# Patient Record
Sex: Female | Born: 1995 | Race: White | Hispanic: No | Marital: Single | Smoking: Never smoker
Health system: Southern US, Community
[De-identification: ages and names within clinical notes are randomized; demographics above are authoritative.]

## PROBLEM LIST (undated history)

## (undated) DIAGNOSIS — F419 Anxiety disorder, unspecified: Secondary | ICD-10-CM

## (undated) DIAGNOSIS — F319 Bipolar disorder, unspecified: Secondary | ICD-10-CM

## (undated) DIAGNOSIS — F329 Major depressive disorder, single episode, unspecified: Secondary | ICD-10-CM

## (undated) DIAGNOSIS — J45909 Unspecified asthma, uncomplicated: Secondary | ICD-10-CM

## (undated) DIAGNOSIS — R51 Headache: Secondary | ICD-10-CM

## (undated) DIAGNOSIS — F603 Borderline personality disorder: Secondary | ICD-10-CM

## (undated) DIAGNOSIS — F988 Other specified behavioral and emotional disorders with onset usually occurring in childhood and adolescence: Secondary | ICD-10-CM

## (undated) DIAGNOSIS — F32A Depression, unspecified: Secondary | ICD-10-CM

## (undated) DIAGNOSIS — R519 Headache, unspecified: Secondary | ICD-10-CM

## (undated) DIAGNOSIS — G8929 Other chronic pain: Secondary | ICD-10-CM

## (undated) DIAGNOSIS — E785 Hyperlipidemia, unspecified: Secondary | ICD-10-CM

## (undated) DIAGNOSIS — K623 Rectal prolapse: Secondary | ICD-10-CM

## (undated) DIAGNOSIS — K219 Gastro-esophageal reflux disease without esophagitis: Secondary | ICD-10-CM

## (undated) DIAGNOSIS — K589 Irritable bowel syndrome without diarrhea: Secondary | ICD-10-CM

## (undated) HISTORY — DX: Bipolar disorder, unspecified: F31.9

## (undated) HISTORY — DX: Borderline personality disorder: F60.3

## (undated) HISTORY — DX: Unspecified asthma, uncomplicated: J45.909

## (undated) HISTORY — DX: Hyperlipidemia, unspecified: E78.5

## (undated) HISTORY — DX: Irritable bowel syndrome, unspecified: K58.9

## (undated) HISTORY — DX: Other specified behavioral and emotional disorders with onset usually occurring in childhood and adolescence: F98.8

## (undated) HISTORY — PX: OTHER SURGICAL HISTORY: SHX169

## (undated) HISTORY — PX: TONSILLECTOMY AND ADENOIDECTOMY: SHX28

## (undated) HISTORY — DX: Headache: R51

## (undated) HISTORY — DX: Headache, unspecified: R51.9

## (undated) HISTORY — DX: Other chronic pain: G89.29

## (undated) HISTORY — DX: Gastro-esophageal reflux disease without esophagitis: K21.9

---

## 2013-03-28 ENCOUNTER — Institutional Professional Consult (permissible substitution): Payer: Self-pay | Admitting: Internal Medicine

## 2013-04-04 ENCOUNTER — Encounter: Payer: Self-pay | Admitting: Internal Medicine

## 2013-04-04 ENCOUNTER — Ambulatory Visit (INDEPENDENT_AMBULATORY_CARE_PROVIDER_SITE_OTHER): Payer: BC Managed Care – PPO | Admitting: Internal Medicine

## 2013-04-04 VITALS — BP 102/60 | HR 103 | Temp 98.0°F | Ht <= 58 in | Wt 124.0 lb

## 2013-04-04 DIAGNOSIS — Z23 Encounter for immunization: Secondary | ICD-10-CM

## 2013-04-04 DIAGNOSIS — J45991 Cough variant asthma: Secondary | ICD-10-CM

## 2013-04-04 MED ORDER — MOMETASONE FURO-FORMOTEROL FUM 100-5 MCG/ACT IN AERO: INHALATION_SPRAY | RESPIRATORY_TRACT | Status: DC

## 2013-04-04 MED ORDER — LEVALBUTEROL TARTRATE 45 MCG/ACT IN AERO: 1.0000 | INHALATION_SPRAY | RESPIRATORY_TRACT | Status: DC | PRN

## 2013-04-04 NOTE — Progress Notes (Signed)
  Subjective:    Patient ID: Joyce Pena, female    DOB: 05-10-1996   MRN: [id number]  HPI  17 yowf never smoker ? 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 Dx of kidnergarden of asthma and seemed better early HS then worse since 2013 ? [place] in [place] may have made it worse then arrived in [place] last week in Aug 2014 and found couldn't tolerate the outdoors well due to heat and humidity  on no maint rx so referred to pulmonary clinic 04/04/13 by Dr Margaretha Sheffield  04/04/2013 [facility]st [facility] office visit/ Maxine Huynh cc daily symptoms pretty much all her life esp with exercise, no benefit to singulair,  ? advair helped ? Maybe not.  Much worse with viral illnesses inpast and has to take a xopenex neb during these for  Severe cough and sob but otherwise just using the saba hfa  No prednisone in recent years  No obvious daytime variabilty or assoc excess or purulent mucus production or cp or chest tightness, subjective wheeze overt sinus or hb symptoms. No unusual exp hx or h/o childhood pna/ asthma or knowledge of premature birth.   Sleeping ok without nocturnal  or early am exacerbation  of respiratory  c/o's or need for noct saba. Also denies any obvious fluctuation of symptoms with weather or environmental changes or other aggravating or alleviating factors except as outlined above  Current Medications, Allergies, Past Medical History, Past Surgical History, Family History, and Social History were reviewed in [employer] record.         Review of Systems  Constitutional: Negative for fever, chills and unexpected weight change.  HENT: Positive for congestion. Negative for ear pain, nosebleeds, sore throat, rhinorrhea, sneezing, trouble swallowing, dental problem, voice change, postnasal drip and sinus pressure.   Eyes: Negative for visual disturbance.  Respiratory: Positive for shortness of breath. Negative for cough and choking.   Cardiovascular: Negative  for chest pain and leg swelling.  Gastrointestinal: Negative for vomiting, abdominal pain and diarrhea.  Genitourinary: Negative for difficulty urinating.       Heartburn Indigestion  Musculoskeletal: Negative for arthralgias.  Skin: Negative for rash.  Neurological: Positive for headaches. Negative for tremors and syncope.  Hematological: Does not bruise/bleed easily.       Objective:   Physical Exam  amb wf nad  Wt Readings from Last 3 Encounters:  04/04/13 124 lb (56.246 kg) (46%*, Z = -0.09)   * Growth percentiles are based on CDC 2-20 Years data.     HEENT: nl dentition, turbinates, and orophanx. Nl external ear canals without cough reflex   NECK :  without JVD/Nodes/TM/ nl carotid upstrokes bilaterally   LUNGS: no acc muscle use, clear to A and P bilaterally without cough on insp or exp maneuvers   CV:  RRR  no s3 or murmur or increase in P2, no edema   ABD:  soft and nontender with nl excursion in the supine position. No bruits or organomegaly, bowel sounds nl  MS:  warm without deformities, calf tenderness, cyanosis or clubbing  SKIN: warm and dry without lesions    NEURO:  alert, approp, no deficits       Assessment & Plan:

## 2013-04-04 NOTE — Patient Instructions (Addendum)
dulera 100 Take 2 puffs first thing in am and then another 2 puffs about 12 hours later - blow dulera out through nose    Only use your albuterol (xopenox) as a rescue medication to be used if you can't catch your breath by resting or doing a relaxed purse lip breathing pattern. The less you use it, the better it will work when you need it. xopenex up to 2 puffs every 4 hours, and then nebulizer to back up the xopenex hfa  For any heartburn symptoms or for any flare of any respiratory > Try prilosec 20mg   Take 30-60 min before first meal of the day and Pepcid 20 mg one bedtime until cough is completely gone for at least a week without the need for cough suppression  I think of reflux for chronic cough like I do oxygen for fire (doesn't cause the fire but once you get the oxygen suppressed it usually goes away regardless of the exact cause).   GERD (REFLUX)  is an extremely common cause of respiratory symptoms, many times with no significant heartburn at all.    It can be treated with medication, but also with lifestyle changes including avoidance of late meals, excessive alcohol, smoking cessation, and avoid fatty foods, chocolate, peppermint, colas, red wine, and acidic juices such as orange juice.  NO MINT OR MENTHOL PRODUCTS SO NO COUGH DROPS  USE SUGARLESS CANDY INSTEAD (jolley ranchers or Stover's)  NO OIL BASED VITAMINS - use powdered substitutes.    Please schedule a follow up office visit in 6 weeks, call sooner if needed

## 2013-04-06 DIAGNOSIS — J45991 Cough variant asthma: Secondary | ICD-10-CM | POA: Insufficient documentation

## 2013-04-06 NOTE — Assessment & Plan Note (Addendum)
DDX of  difficult airways managment all start with A and  include Adherence, Ace Inhibitors, Acid Reflux, Active Sinus Disease, Alpha 1 Antitripsin deficiency, Anxiety masquerading as Airways dz,  ABPA,  allergy(esp in young), Aspiration (esp in elderly), Adverse effects of DPI,  Active smokers, plus two Bs  = Bronchiectasis and Beta blocker use..and one C= CHF   In this case Adherence is the biggest issue and starts with  inability to use HFA effectively and also  understand that SABA treats the symptoms but doesn't get to the underlying problem (inflammation).  I used  the analogy of putting steroid cream on a rash to help explain the meaning of topical therapy and the need to get the drug to the target tissue.  Try dulera 100 2bid then step down to qvar if all goals of care met  ? Allergy> defer w/u for now pending results of dulera trial    ? Acid reflux > at risk due to bcps> rx diet/ add meds if cough flares   The proper method of use, as well as anticipated side effects, of a metered-dose inhaler are discussed and demonstrated to the patient. Improved effectiveness after extensive coaching during this visit to a level of approximately  90%

## 2013-04-08 ENCOUNTER — Emergency Department (HOSPITAL_COMMUNITY)
Admission: EM | Admit: 2013-04-08 | Discharge: 2013-04-08 | Disposition: A | Discharge status: Home / Self Care | Payer: BC Managed Care – PPO | Attending: Emergency Medicine | Admitting: Emergency Medicine

## 2013-04-08 ENCOUNTER — Encounter (HOSPITAL_COMMUNITY): Payer: Self-pay | Admitting: *Deleted

## 2013-04-08 DIAGNOSIS — Z79899 Other long term (current) drug therapy: Secondary | ICD-10-CM | POA: Insufficient documentation

## 2013-04-08 DIAGNOSIS — F988 Other specified behavioral and emotional disorders with onset usually occurring in childhood and adolescence: Secondary | ICD-10-CM | POA: Insufficient documentation

## 2013-04-08 DIAGNOSIS — J45909 Unspecified asthma, uncomplicated: Secondary | ICD-10-CM | POA: Insufficient documentation

## 2013-04-08 DIAGNOSIS — K625 Hemorrhage of anus and rectum: Secondary | ICD-10-CM | POA: Insufficient documentation

## 2013-04-08 DIAGNOSIS — Z3202 Encounter for pregnancy test, result negative: Secondary | ICD-10-CM | POA: Insufficient documentation

## 2013-04-08 DIAGNOSIS — Z8639 Personal history of other endocrine, nutritional and metabolic disease: Secondary | ICD-10-CM | POA: Insufficient documentation

## 2013-04-08 DIAGNOSIS — Z862 Personal history of diseases of the blood and blood-forming organs and certain disorders involving the immune mechanism: Secondary | ICD-10-CM | POA: Insufficient documentation

## 2013-04-08 LAB — URINALYSIS, ROUTINE W REFLEX MICROSCOPIC
Bilirubin Urine: NEGATIVE
Hgb urine dipstick: NEGATIVE
Specific Gravity, Urine: 1 — ABNORMAL LOW (ref 1.005–1.030)
pH: 7 (ref 5.0–8.0)

## 2013-04-08 LAB — POCT I-STAT, CHEM 8
BUN: <3 mg/dL — ABNORMAL LOW (ref 6–23)
Chloride: 107 mEq/L (ref 96–112)
Creatinine, Ser: 0.9 mg/dL (ref 0.50–1.10)
Potassium: 4 mEq/L (ref 3.5–5.1)
Sodium: 141 mEq/L (ref 135–145)

## 2013-04-08 NOTE — ED Notes (Signed)
Pt is here with blood in her stool that she noticed at 1230 today.  Pt only saw it when she wiped.  Pt has irritable bowel and mucous is normal for her

## 2013-04-08 NOTE — ED Notes (Signed)
1637  Pt is now also C/O pain and cramping in the abdomen and down into the pelvis.

## 2013-04-08 NOTE — ED Provider Notes (Signed)
CSN: [id number]     Arrival date & time 04/08/13  1358 History   First MD Initiated Contact with Patient 04/08/13 1615     Chief Complaint  Patient presents with  . Rectal Bleeding   (Consider location/radiation/quality/duration/timing/severity/associated sxs/prior Treatment) HPI Patient is a Archivist here locally.  She is originally from [place].  She presents the emergency department today because of new rectal bleeding which was noted when she was having a loose stool.  She stated she noted blood on the tissue as well as a very small amount of toilet.  She's had none since then.  She denies abdominal pain.  No nausea or vomiting.  She has no prior history of GI bleeding.  She's on no anticoagulants.  She has a long-standing history of IBS and follows with gastroenterology in [place].  Her symptoms are mild.  She denies vaginal bleeding. Past Medical History  Diagnosis Date  . Asthma   . IBS (irritable bowel syndrome)   . ADD (attention deficit disorder)   . Hyperlipidemia    History reviewed. No pertinent past surgical history. Family History  Problem Relation Age of Onset  . Emphysema Paternal Grandfather   . Asthma Cousin   . Heart disease Father   . Breast cancer Paternal Grandmother   . Colon cancer Paternal Grandmother    History  Substance Use Topics  . Smoking status: Never Smoker   . Smokeless tobacco: Never Used  . Alcohol Use: No   OB History   Grav Para Term Preterm Abortions TAB SAB Ect Mult Living                 Review of Systems  All other systems reviewed and are negative.    Allergies  Review of patient's allergies indicates no known allergies.  Home Medications   Current Outpatient Rx  Name  Route  Sig  Dispense  Refill  . amphetamine-dextroamphetamine (ADDERALL) 5 MG tablet   Oral   Take 5 mg by mouth daily as needed (attention).          Marland Kitchen aspirin-acetaminophen-caffeine (EXCEDRIN MIGRAINE) 250-250-65 MG per tablet   Oral   Take 1  tablet by mouth every 6 (six) hours as needed for pain.         . famotidine (PEPCID) 20 MG tablet   Oral   Take 20 mg by mouth at bedtime.         . levalbuterol (XOPENEX HFA) 45 MCG/ACT inhaler   Inhalation   Inhale 1-2 puffs into the lungs every 4 (four) hours as needed for wheezing or shortness of breath.   1 Inhaler   11   . lisdexamfetamine (VYVANSE) 40 MG capsule   Oral   Take 40 mg by mouth every morning.         . mometasone-formoterol (DULERA) 100-5 MCG/ACT AERO      Take 2 puffs first thing in am and then another 2 puffs about 12 hours later.   1 Inhaler   0   . Multiple Vitamin (MULTIVITAMIN) capsule   Oral   Take 1 capsule by mouth daily.         . Norethindrone Acetate-Ethinyl Estrad-FE (LOMEDIA 24 FE) 1-20 MG-MCG(24) tablet   Oral   Take 1 tablet by mouth daily.         . nortriptyline (PAMELOR) 25 MG capsule   Oral   Take 25 mg by mouth daily.         Marland Kitchen  omeprazole (PRILOSEC OTC) 20 MG tablet   Oral   Take 20 mg by mouth daily.          BP 123/88  Pulse 113  Temp(Src) 97.6 F (36.4 C) (Oral)  Resp 20  SpO2 100%  LMP 03/28/2013 Physical Exam  Nursing note and vitals reviewed. Constitutional: She is oriented to person, place, and time. She appears well-developed and well-nourished. No distress.  HENT:  Head: Normocephalic and atraumatic.  Eyes: EOM are normal.  Neck: Normal range of motion.  Cardiovascular: Normal rate, regular rhythm and normal heart sounds.   Pulmonary/Chest: Effort normal and breath sounds normal.  Abdominal: Soft. She exhibits no distension. There is no tenderness.  Genitourinary:  No gross blood. No masses. No tenderness.  No external hemorrhoids  Musculoskeletal: Normal range of motion.  Neurological: She is alert and oriented to person, place, and time.  Skin: Skin is warm and dry.  Psychiatric: She has a normal mood and affect. Judgment normal.    ED Course  Procedures (including critical care  time) Labs Review Labs Reviewed  URINALYSIS, ROUTINE W REFLEX MICROSCOPIC - Abnormal; Notable for the following:    Specific Gravity, Urine 1.000 (*)    All other components within normal limits  POCT I-STAT, CHEM 8 - Abnormal; Notable for the following:    BUN <3 (*)    Calcium, Ion 1.24 (*)    All other components within normal limits  PREGNANCY, URINE   Imaging Review No results found.  MDM   1. Rectal bleeding    Normal vital signs.  Hemoglobin normal.  Discharge home in good condition.  Outpatient GI followup.  Abdominal exam is benign.  Suspect colitis versus internal hemorrhoid     Lyanne Co, MD 04/08/13 [phone]

## 2013-04-13 ENCOUNTER — Telehealth: Payer: Self-pay | Admitting: Internal Medicine

## 2013-04-13 ENCOUNTER — Encounter: Payer: Self-pay | Admitting: Gastroenterology

## 2013-04-13 ENCOUNTER — Ambulatory Visit (INDEPENDENT_AMBULATORY_CARE_PROVIDER_SITE_OTHER): Payer: BC Managed Care – PPO | Admitting: Gastroenterology

## 2013-04-13 VITALS — BP 110/60 | HR 96 | Ht <= 58 in | Wt 123.1 lb

## 2013-04-13 DIAGNOSIS — K625 Hemorrhage of anus and rectum: Secondary | ICD-10-CM | POA: Insufficient documentation

## 2013-04-13 DIAGNOSIS — R197 Diarrhea, unspecified: Secondary | ICD-10-CM | POA: Insufficient documentation

## 2013-04-13 MED ORDER — HYDROCORTISONE ACETATE 25 MG RE SUPP: RECTAL | Status: DC

## 2013-04-13 NOTE — Telephone Encounter (Signed)
Spoke with patient and she states she went to ED for rectal bleeding. She has had rectal bleeding again today when she went to have a bowel movement. She reports she was diagnosed with IBS 5 years ago. She states she saw a GI MD in [place] at that time. She has never had a colonoscopy. She reports diarrhea 1-3/day which she states is her "norm." She reports abdominal cramping on both sides that goes to her back. She is also having some nausea. Scheduled with Doug Sou, PA at 3:30 PM today.

## 2013-04-13 NOTE — Patient Instructions (Addendum)
We sent a prescription for the Anusol suppositories to use rectally at bedtime to [facility] [address]. You have been scheduled for a colonoscopy with propofol. Please follow written instructions given to you at your visit today.  Please pick up your prep kit at the pharmacy within the next 1-3 days. If you use inhalers (even only as needed), please bring them with you on the day of your procedure.

## 2013-04-13 NOTE — Progress Notes (Signed)
04/13/2013 Marijayne Brose [id number] 07/24/1996   HISTORY OF PRESENT ILLNESS:  Patient is an 17 year old female who just moved here from [place], [place] to go to college at [employer].  She says that she has long-standing diarrhea and was told that she had IBS several years ago.  She had previously seen a GI doctor in [place] for acid reflux but has not been seen there in at least 5 or 6 years.  Has diarrhea 1-3 times a day.  Says that she thinks that she is lactose intolerant as well.  She recently had two episodes of rectal bleeding.  The first one was this past Sunday and she went to the ED where she was hemoccult negative.  She was discharged and apparently told that she should have a colonoscopy.  Says that she has been told that she needs to have a colonoscopy by another physician recently as well.  Then today she had another episode of bleeding described as a moderate amount of bright red blood on the toilet tissue.  Says that her PGF had colon cancer and her father had what sounds like rectal prolapse, but no other family history of chronic GI illness to her knowledge.  Gets crampy abdominal pains that go into her back at times.  Some nausea but no vomiting.  She was recently put back on omeprazole in the morning and pepcid at night by Dr. Wert; she saw him for her asthma.    Past Medical History  Diagnosis Date  . Asthma   . IBS (irritable bowel syndrome)   . ADD (attention deficit disorder)   . Hyperlipidemia   . GERD (gastroesophageal reflux disease)   . Chronic headaches    History reviewed. No pertinent past surgical history.  reports that she has never smoked. She has never used smokeless tobacco. She reports that she does not drink alcohol or use illicit drugs. family history includes Asthma in her cousin; Breast cancer in her paternal grandmother; Colon cancer in her paternal grandfather; Diabetes in her paternal grandfather; Emphysema in her paternal grandfather; Heart  disease in her father, paternal grandfather, and paternal grandmother; Irritable bowel syndrome in her sister. Allergies  Allergen Reactions  . Banana Other (See Comments)    Mouth sores      Outpatient Encounter Prescriptions as of 04/13/2013  Medication Sig Dispense Refill  . amphetamine-dextroamphetamine (ADDERALL) 5 MG tablet Take 5 mg by mouth daily as needed (attention).       . aspirin-acetaminophen-caffeine (EXCEDRIN MIGRAINE) 250-250-65 MG per tablet Take 1 tablet by mouth every 6 (six) hours as needed for pain.      . famotidine (PEPCID) 20 MG tablet Take 20 mg by mouth at bedtime.      . levalbuterol (XOPENEX HFA) 45 MCG/ACT inhaler Inhale 1-2 puffs into the lungs every 4 (four) hours as needed for wheezing or shortness of breath.  1 Inhaler  11  . lisdexamfetamine (VYVANSE) 40 MG capsule Take 40 mg by mouth every morning.      . mometasone-formoterol (DULERA) 100-5 MCG/ACT AERO Take 2 puffs first thing in am and then another 2 puffs about 12 hours later.  1 Inhaler  0  . Multiple Vitamin (MULTIVITAMIN) capsule Take 1 capsule by mouth daily.      . Norethindrone Acetate-Ethinyl Estrad-FE (LOMEDIA 24 FE) 1-20 MG-MCG(24) tablet Take 1 tablet by mouth daily.      . nortriptyline (PAMELOR) 25 MG capsule Take 25 mg by mouth daily.      .   omeprazole (PRILOSEC OTC) 20 MG tablet Take 20 mg by mouth daily.      . hydrocortisone (ANUSOL-HC) 25 MG suppository Use 1 suppository at bedtime  7 suppository  1   No facility-administered encounter medications on file as of 04/13/2013.     REVIEW OF SYSTEMS  : All other systems reviewed and negative except where noted in the History of Present Illness.   PHYSICAL EXAM: BP 110/60  Pulse 96  Ht 4' 9" (1.448 m)  Wt 123 lb 2 oz (55.849 kg)  BMI 26.64 kg/m2  LMP 03/07/2013 General: Well developed white female in no acute distress Head: Normocephalic and atraumatic Eyes:  Sclerae anicteric, conjunctiva pink. Ears: Normal auditory  acuity. Lungs: Clear throughout to auscultation. Heart: Regular rate and rhythm. Abdomen: Soft, non-distended. No masses or hepatomegaly noted. Normal bowel sounds.  Mild diffuse TTP but abdomen benign. Rectal: Deferred.  Will be done at the time of colonoscopy. Musculoskeletal: Symmetrical with no gross deformities  Skin: No lesions on visible extremities Extremities: No edema  Neurological: Alert oriented x 4, grossly nonfocal Psychological:  Alert and cooperative. Normal mood and affect  ASSESSMENT AND PLAN: -Long-standing diarrhea now with rectal bleeding.  Most likely IBS, which provoked hemorrhoidal bleeding, however, will rule out IBD and other causes.  She also thinks that she is lactose intolerant, which will contribute to the diarrhea as well.  Patient would like a colonoscopy for evaluation.  Will give anusol suppositories to use at bedtime in the interim.  

## 2013-04-14 NOTE — Progress Notes (Signed)
Reviewed, and agree, ulcerative proctitis would have been diagnosed if  anoscopy performed. [facility] colonoscopy  Is soon.

## 2013-04-16 ENCOUNTER — Encounter: Payer: Self-pay | Admitting: Internal Medicine

## 2013-04-18 NOTE — H&P (View-Only) (Signed)
04/13/2013 Joyce Pena [id number] 08-18-1995   HISTORY OF PRESENT ILLNESS:  Patient is an 17 year old female who just moved here from [place], [place] to go to college at [employer].  She says that she has long-standing diarrhea and was told that she had IBS several years ago.  She had previously seen a GI doctor in [place] for acid reflux but has not been seen there in at least 5 or 6 years.  Has diarrhea 1-3 times a day.  Says that she thinks that she is lactose intolerant as well.  She recently had two episodes of rectal bleeding.  The first one was this past Sunday and she went to the ED where she was hemoccult negative.  She was discharged and apparently told that she should have a colonoscopy.  Says that she has been told that she needs to have a colonoscopy by another physician recently as well.  Then today she had another episode of bleeding described as a moderate amount of bright red blood on the toilet tissue.  Says that her PGF had colon cancer and her father had what sounds like rectal prolapse, but no other family history of chronic GI illness to her knowledge.  Gets crampy abdominal pains that go into her back at times.  Some nausea but no vomiting.  She was recently put back on omeprazole in the morning and pepcid at night by Dr. Sherene Sires; she saw him for her asthma.    Past Medical History  Diagnosis Date  . Asthma   . IBS (irritable bowel syndrome)   . ADD (attention deficit disorder)   . Hyperlipidemia   . GERD (gastroesophageal reflux disease)   . Chronic headaches    History reviewed. No pertinent past surgical history.  reports that she has never smoked. She has never used smokeless tobacco. She reports that she does not drink alcohol or use illicit drugs. family history includes Asthma in her cousin; Breast cancer in her paternal grandmother; Colon cancer in her paternal grandfather; Diabetes in her paternal grandfather; Emphysema in her paternal grandfather; Heart  disease in her father, paternal grandfather, and paternal grandmother; Irritable bowel syndrome in her sister. Allergies  Allergen Reactions  . Banana Other (See Comments)    Mouth sores      Outpatient Encounter Prescriptions as of 04/13/2013  Medication Sig Dispense Refill  . amphetamine-dextroamphetamine (ADDERALL) 5 MG tablet Take 5 mg by mouth daily as needed (attention).       Marland Kitchen aspirin-acetaminophen-caffeine (EXCEDRIN MIGRAINE) 250-250-65 MG per tablet Take 1 tablet by mouth every 6 (six) hours as needed for pain.      . famotidine (PEPCID) 20 MG tablet Take 20 mg by mouth at bedtime.      . levalbuterol (XOPENEX HFA) 45 MCG/ACT inhaler Inhale 1-2 puffs into the lungs every 4 (four) hours as needed for wheezing or shortness of breath.  1 Inhaler  11  . lisdexamfetamine (VYVANSE) 40 MG capsule Take 40 mg by mouth every morning.      . mometasone-formoterol (DULERA) 100-5 MCG/ACT AERO Take 2 puffs first thing in am and then another 2 puffs about 12 hours later.  1 Inhaler  0  . Multiple Vitamin (MULTIVITAMIN) capsule Take 1 capsule by mouth daily.      . Norethindrone Acetate-Ethinyl Estrad-FE (LOMEDIA 24 FE) 1-20 MG-MCG(24) tablet Take 1 tablet by mouth daily.      . nortriptyline (PAMELOR) 25 MG capsule Take 25 mg by mouth daily.      Marland Kitchen  omeprazole (PRILOSEC OTC) 20 MG tablet Take 20 mg by mouth daily.      . hydrocortisone (ANUSOL-HC) 25 MG suppository Use 1 suppository at bedtime  7 suppository  1   No facility-administered encounter medications on file as of 04/13/2013.     REVIEW OF SYSTEMS  : All other systems reviewed and negative except where noted in the History of Present Illness.   PHYSICAL EXAM: BP 110/60  Pulse 96  Ht 4\' 9"  (1.448 m)  Wt 123 lb 2 oz (55.849 kg)  BMI 26.64 kg/m2  LMP 03/07/2013 General: Well developed white female in no acute distress Head: Normocephalic and atraumatic Eyes:  Sclerae anicteric, conjunctiva pink. Ears: Normal auditory  acuity. Lungs: Clear throughout to auscultation. Heart: Regular rate and rhythm. Abdomen: Soft, non-distended. No masses or hepatomegaly noted. Normal bowel sounds.  Mild diffuse TTP but abdomen benign. Rectal: Deferred.  Will be done at the time of colonoscopy. Musculoskeletal: Symmetrical with no gross deformities  Skin: No lesions on visible extremities Extremities: No edema  Neurological: Alert oriented x 4, grossly nonfocal Psychological:  Alert and cooperative. Normal mood and affect  ASSESSMENT AND PLAN: -Long-standing diarrhea now with rectal bleeding.  Most likely IBS, which provoked hemorrhoidal bleeding, however, will rule out IBD and other causes.  She also thinks that she is lactose intolerant, which will contribute to the diarrhea as well.  Patient would like a colonoscopy for evaluation.  Will give anusol suppositories to use at bedtime in the interim.

## 2013-04-18 NOTE — Interval H&P Note (Signed)
History and Physical Interval Note:  04/18/2013 9:00 PM  Joyce Pena  has presented today for surgery, with the diagnosis of 569.3  The various methods of treatment have been discussed with the patient and family. After consideration of risks, benefits and other options for treatment, the patient has consented to  Procedure(s): COLONOSCOPY (N/A) as a surgical intervention .  The patient's history has been reviewed, patient examined, no change in status, stable for surgery.  I have reviewed the patient's chart and labs.  Questions were answered to the patient's satisfaction.     Lina Sar

## 2013-04-19 ENCOUNTER — Encounter: Payer: Self-pay | Admitting: *Deleted

## 2013-04-19 ENCOUNTER — Ambulatory Visit (HOSPITAL_COMMUNITY)
Admission: RE | Admit: 2013-04-19 | Discharge: 2013-04-19 | Disposition: A | Discharge status: Home / Self Care | Payer: BC Managed Care – PPO | Source: Ambulatory Visit | Attending: Internal Medicine | Admitting: Internal Medicine

## 2013-04-19 ENCOUNTER — Encounter (HOSPITAL_COMMUNITY): Payer: Self-pay

## 2013-04-19 ENCOUNTER — Encounter (HOSPITAL_COMMUNITY)
Admission: RE | Disposition: A | Discharge status: Home / Self Care | Payer: Self-pay | Source: Ambulatory Visit | Attending: Internal Medicine

## 2013-04-19 DIAGNOSIS — Z800 Family history of malignant neoplasm of digestive organs: Secondary | ICD-10-CM | POA: Insufficient documentation

## 2013-04-19 DIAGNOSIS — R197 Diarrhea, unspecified: Secondary | ICD-10-CM

## 2013-04-19 DIAGNOSIS — E785 Hyperlipidemia, unspecified: Secondary | ICD-10-CM | POA: Insufficient documentation

## 2013-04-19 DIAGNOSIS — D1330 Benign neoplasm of unspecified part of small intestine: Secondary | ICD-10-CM | POA: Insufficient documentation

## 2013-04-19 DIAGNOSIS — K219 Gastro-esophageal reflux disease without esophagitis: Secondary | ICD-10-CM | POA: Insufficient documentation

## 2013-04-19 DIAGNOSIS — K625 Hemorrhage of anus and rectum: Secondary | ICD-10-CM

## 2013-04-19 DIAGNOSIS — J45909 Unspecified asthma, uncomplicated: Secondary | ICD-10-CM | POA: Insufficient documentation

## 2013-04-19 DIAGNOSIS — Z79899 Other long term (current) drug therapy: Secondary | ICD-10-CM | POA: Insufficient documentation

## 2013-04-19 HISTORY — PX: COLONOSCOPY: SHX5424

## 2013-04-19 SURGERY — COLONOSCOPY
Anesthesia: Moderate Sedation

## 2013-04-19 MED ORDER — MIDAZOLAM HCL 5 MG/5ML IJ SOLN
INTRAMUSCULAR | Status: DC | PRN
  Administered 2013-04-19: 1 mg via INTRAVENOUS
  Administered 2013-04-19 (×2): 2 mg via INTRAVENOUS
  Administered 2013-04-19 (×4): 1 mg via INTRAVENOUS

## 2013-04-19 MED ORDER — MIDAZOLAM HCL 10 MG/2ML IJ SOLN
INTRAMUSCULAR | Status: AC
  Filled 2013-04-19: qty 2

## 2013-04-19 MED ORDER — DIPHENHYDRAMINE HCL 50 MG/ML IJ SOLN
INTRAMUSCULAR | Status: AC
  Filled 2013-04-19: qty 1

## 2013-04-19 MED ORDER — FENTANYL CITRATE 0.05 MG/ML IJ SOLN
INTRAMUSCULAR | Status: DC | PRN
  Administered 2013-04-19 (×4): 25 ug via INTRAVENOUS

## 2013-04-19 MED ORDER — FENTANYL CITRATE 0.05 MG/ML IJ SOLN
INTRAMUSCULAR | Status: AC
  Filled 2013-04-19: qty 2

## 2013-04-19 MED ORDER — DIPHENHYDRAMINE HCL 50 MG/ML IJ SOLN
INTRAMUSCULAR | Status: DC | PRN
  Administered 2013-04-19: 25 mg via INTRAVENOUS

## 2013-04-19 MED ORDER — SODIUM CHLORIDE 0.9 % IV SOLN
INTRAVENOUS | Status: DC
  Administered 2013-04-19: 09:00:00 via INTRAVENOUS

## 2013-04-19 MED ORDER — DIPHENOXYLATE-ATROPINE 2.5-0.025 MG PO TABS: 1.0000 | ORAL_TABLET | Freq: Two times a day (BID) | ORAL | Status: DC | PRN

## 2013-04-19 NOTE — Op Note (Signed)
[facility] [address] [place], [address]   COLONOSCOPY PROCEDURE REPORT  PATIENT: Joyce Pena, Joyce Pena  MR#: [id number] BIRTHDATE: [birth year] , 16  yrs. old GENDER: Female ENDOSCOPIST: Hart Carwin, MD REFERRED BY:  Nyoka Cowden, M.D. PROCEDURE DATE:  04/19/2013 PROCEDURE:   Colonoscopy with biopsy ASA CLASS:   Class I INDICATIONS:Rectal Bleeding. ,diarrhea, seen in ED, ,r/o IBD MEDICATIONS: These medications were titrated to patient response per physician's verbal order, Benadryl 25 mg IV, Versed 9 mg IV, and Fentanyl 100 mcg IV  DESCRIPTION OF PROCEDURE:   After the risks and benefits and of the procedure were explained, informed consent was obtained.  A digital rectal exam revealed no abnormalities of the rectum.    The Pentax Ped Colon [id number]  endoscope was introduced through the anus and advanced to the terminal ileum which was intubated for a short distance .  The quality of the prep was excellent, using MoviPrep . The instrument was then slowly withdrawn as the colon was fully examined.     COLON FINDINGS: A normal appearing cecum, ileocecal valve, and appendiceal orifice were identified.  The ascending, hepatic flexure, transverse, splenic flexure, descending, sigmoid colon and rectum appeared unremarkable.  No polyps or cancers were seen. Multiple random biopsies of the area were performed. Retroflexed views revealed no abnormalities.     The scope was then withdrawn from the patient and the procedure completed.  COMPLICATIONS: There were no complications. ENDOSCOPIC IMPRESSION: Normal colon; multiple random biopsies of the TI,colon and rectum, no evidence of IBD No hemorrhoidsd superficial fissuring of the rectum likely source of the bleeding  RECOMMENDATIONS: Await biopsy results add Lomotil qd prn diarrhea continue rectal care ,Anusol HC supp follow up OV   REPEAT EXAM: no recall  cc:  _______________________________ eSignedHart Carwin, MD 04/19/2013 11:16 AM     PATIENT NAME:  Joyce Pena, Joyce Pena MR#: [id number]

## 2013-04-20 ENCOUNTER — Encounter (HOSPITAL_COMMUNITY): Payer: Self-pay | Admitting: Internal Medicine

## 2013-04-20 ENCOUNTER — Encounter: Payer: Self-pay | Admitting: Internal Medicine

## 2013-05-08 ENCOUNTER — Ambulatory Visit: Payer: BC Managed Care – PPO | Admitting: Internal Medicine

## 2013-05-09 ENCOUNTER — Encounter: Payer: Self-pay | Admitting: Internal Medicine

## 2013-05-09 ENCOUNTER — Ambulatory Visit: Payer: BC Managed Care – PPO | Admitting: Internal Medicine

## 2013-05-09 ENCOUNTER — Ambulatory Visit (INDEPENDENT_AMBULATORY_CARE_PROVIDER_SITE_OTHER): Payer: BC Managed Care – PPO | Admitting: Internal Medicine

## 2013-05-09 VITALS — BP 104/68 | HR 80 | Temp 98.8°F | Ht <= 58 in | Wt 122.0 lb

## 2013-05-09 DIAGNOSIS — J45991 Cough variant asthma: Secondary | ICD-10-CM

## 2013-05-09 DIAGNOSIS — R0600 Dyspnea, unspecified: Secondary | ICD-10-CM

## 2013-05-09 DIAGNOSIS — R0609 Other forms of dyspnea: Secondary | ICD-10-CM

## 2013-05-09 MED ORDER — MOMETASONE FURO-FORMOTEROL FUM 100-5 MCG/ACT IN AERO: INHALATION_SPRAY | RESPIRATORY_TRACT | Status: DC

## 2013-05-09 NOTE — Progress Notes (Signed)
Subjective:    Patient ID: Joyce Pena, female    DOB: 01-02-1996   MRN: [id number]   Brief patient profile:   16 yowf never smoker ? 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 Dx of kidnergarden of asthma and seemed better early HS then worse since 2013 ? Pollen in [place] may have made it worse then arrived in [place] last week in Aug 2014 and found couldn't tolerate the outdoors well due to heat and humidity  on no maint rx so referred to pulmonary clinic 04/04/13 by Dr Margaretha Sheffield.    History of Present Illness  04/04/2013 1st [facility] office visit/ Joyce Pena cc daily symptoms pretty much all her life esp with exercise, no benefit to singulair,  ? advair helped ? Maybe not.  Much worse with viral illnesses in past and has to take a xopenex neb during these for  Severe cough and sob but otherwise just using the saba hfa. rec dulera 100 Take 2 puffs first thing in am and then another 2 puffs about 12 hours later - blow dulera out through nose  only use your albuterol (xopenox) as a rescue medication   Try prilosec 20mg   Take 30-60 min before first meal of the day and Pepcid 20 mg one bedtime until cough is completely gone for at least a week without the need for cough suppression GERD diet    05/09/2013 f/u ov/Joyce Pena re: sob ? asthma Chief Complaint  Patient presents with  . Follow-up    Pt states that her breathing had improved, but has worsened slightly since weather turned colder. She has not had to use rescue inhaler since her last visit.   doe x can't walk 100 yards with back pack s giving out. Variably taking gerd rx for overt HB esp in pm Never wake up with cough or wheeze or need saba  No obvious day to day or daytime variabilty or assoc chronic cough or cp or chest tightness, subjective wheeze overt sinus or hb symptoms. No unusual exp hx or h/o childhood pna/ asthma or knowledge of premature birth.  Sleeping ok without nocturnal  or early am exacerbation  of  respiratory  c/o's or need for noct saba. Also denies any obvious fluctuation of symptoms with weather or environmental changes or other aggravating or alleviating factors except as outlined above   Current Medications, Allergies, Complete Past Medical History, Past Surgical History, Family History, and Social History were reviewed in [employer] record.  ROS  The following are not active complaints unless bolded sore throat, dysphagia, dental problems, itching, sneezing,  nasal congestion or excess/ purulent secretions, ear ache,   fever, chills, sweats, unintended wt loss, pleuritic or exertional cp, hemoptysis,  orthopnea pnd or leg swelling, presyncope, palpitations, heartburn, abdominal pain, anorexia, nausea, vomiting, diarrhea  or change in bowel or urinary habits, change in stools or urine, dysuria,hematuria,  rash, arthralgias, visual complaints, headache, numbness weakness or ataxia or problems with walking or coordination,  change in mood/affect or memory.                    Objective:   Physical Exam  amb wf nad   Wt Readings from Last 3 Encounters:  05/09/13 122 lb (55.339 kg) (42%*, Z = -0.21)  04/13/13 123 lb 2 oz (55.849 kg) (45%*, Z = -0.14)  04/04/13 124 lb (56.246 kg) (46%*, Z = -0.09)   * Growth percentiles are based on  CDC 2-20 Years data.        HEENT: nl dentition, turbinates, and orophanx. Nl external ear canals without cough reflex   NECK :  without JVD/Nodes/TM/ nl carotid upstrokes bilaterally   LUNGS: no acc muscle use, clear to A and P bilaterally without cough on insp or exp maneuvers   CV:  RRR  no s3 or murmur or increase in P2, no edema   ABD:  soft and nontender with nl excursion in the supine position. No bruits or organomegaly, bowel sounds nl  MS:  warm without deformities, calf tenderness, cyanosis or clubbing          Assessment & Plan:

## 2013-05-09 NOTE — Patient Instructions (Addendum)
Dulera 100 Take 2 puffs first thing in am and then another 2 puffs about 12 hours later.   Only use your albuterol as a rescue medication to be used if you can't catch your breath by resting or doing a relaxed purse lip breathing pattern.  - The less you use it, the better it will work when you need it. - Ok to use up to every 4 hours if you must but call for immediate appointment if use goes up over your usual need - Don't leave home without it !!  (think of it like your spare tire for your car)   Try prilosec 20mg   Take 30-60 min before first meal of the day and Pepcid 20 mg one bedtime until cough is completely gone for at least a week without the need for cough suppression  Please schedule a follow up office visit in 4 weeks, sooner if needed with pfts on return

## 2013-05-11 NOTE — Assessment & Plan Note (Signed)
Followed in Pulmonary clinic/ [facility]/ [facility] - hfa 90% p coaching 04/04/13  - spirometry wnl 05/09/13  All goals of chronic asthma control met including optimal function and elimination of symptoms with minimal need for rescue therapy.  Contingencies discussed in full including contacting this office immediately if not controlling the symptoms using the rule of two's.

## 2013-05-11 NOTE — Assessment & Plan Note (Addendum)
-   05/09/2013  Walked RA x 3 laps @ 185 ft each stopped due to  End of study no desat - 05/09/2013 Spirometry wnl   Symptoms are markedly disproportionate to objective findings and not clear this is all a  lung problem but pt does appear to have difficult airway management issues. DDX of  difficult airways managment all start with A and  include Adherence, Ace Inhibitors, Acid Reflux, Active Sinus Disease, Alpha 1 Antitripsin deficiency, Anxiety masquerading as Airways dz,  ABPA,  allergy(esp in young), Aspiration (esp in elderly), Adverse effects of DPI,  Active smokers, plus two Bs  = Bronchiectasis and Beta blocker use..and one C= CHF  Adherence is always the initial "prime suspect" and is a multilayered concern that requires a "trust but verify" approach in every patient - starting with knowing how to use medications, especially inhalers, correctly, keeping up with refills and understanding the fundamental difference between maintenance and prns vs those medications only taken for a very short course and then stopped and not refilled. The proper method of use, as well as anticipated side effects, of a metered-dose inhaler are discussed and demonstrated to the patient. Improved effectiveness after extensive coaching during this visit to a level of approximately  90% so continue dulera 100 2bid  ? Acid (or non-acid) GERD > always difficult to exclude as up to 75% of pts in some series report no assoc GI/ Heartburn symptoms> rec max (24h)  acid suppression and diet restrictions/ reviewed and instructions given in writting   See instructions for specific recommendations which were reviewed directly with the patient who was given a copy with highlighter outlining the key components.

## 2013-06-20 ENCOUNTER — Ambulatory Visit: Payer: BC Managed Care – PPO | Admitting: Internal Medicine

## 2013-08-28 ENCOUNTER — Other Ambulatory Visit: Payer: Self-pay | Admitting: Internal Medicine

## 2013-08-28 DIAGNOSIS — J45909 Unspecified asthma, uncomplicated: Secondary | ICD-10-CM

## 2013-08-29 ENCOUNTER — Encounter: Payer: Self-pay | Admitting: Internal Medicine

## 2013-08-29 ENCOUNTER — Ambulatory Visit (INDEPENDENT_AMBULATORY_CARE_PROVIDER_SITE_OTHER): Payer: BC Managed Care – PPO | Admitting: Internal Medicine

## 2013-08-29 VITALS — BP 122/64 | HR 98 | Temp 98.4°F | Ht <= 58 in | Wt 136.0 lb

## 2013-08-29 DIAGNOSIS — J45991 Cough variant asthma: Secondary | ICD-10-CM

## 2013-08-29 DIAGNOSIS — R059 Cough, unspecified: Secondary | ICD-10-CM

## 2013-08-29 DIAGNOSIS — J45909 Unspecified asthma, uncomplicated: Secondary | ICD-10-CM

## 2013-08-29 DIAGNOSIS — R05 Cough: Secondary | ICD-10-CM

## 2013-08-29 DIAGNOSIS — R058 Other specified cough: Secondary | ICD-10-CM

## 2013-08-29 LAB — PULMONARY FUNCTION TEST
DL/VA % pred: 119 %
DL/VA: 4.67 ml/min/mmHg/L
DLCO UNC % PRED: 123 %
DLCO UNC: 20.03 ml/min/mmHg
FEF 25-75 POST: 2.86 L/s
FEF 25-75 Pre: 2.46 L/sec
FEF2575-%Change-Post: 16 %
FEF2575-%Pred-Post: 82 %
FEF2575-%Pred-Pre: 71 %
FEV1-%Change-Post: 4 %
FEV1-%Pred-Post: 103 %
FEV1-%Pred-Pre: 98 %
FEV1-POST: 2.88 L
FEV1-Pre: 2.76 L
FEV1FVC-%CHANGE-POST: 6 %
FEV1FVC-%Pred-Pre: 88 %
FEV6-%CHANGE-POST: -1 %
FEV6-%PRED-PRE: 114 %
FEV6-%Pred-Post: 112 %
FEV6-PRE: 3.55 L
FEV6-Post: 3.5 L
FEV6FVC-%PRED-POST: 99 %
FEV6FVC-%Pred-Pre: 99 %
FVC-%Change-Post: -1 %
FVC-%PRED-POST: 113 %
FVC-%Pred-Pre: 115 %
FVC-POST: 3.5 L
FVC-PRE: 3.57 L
POST FEV6/FVC RATIO: 100 %
Post FEV1/FVC ratio: 82 %
Pre FEV1/FVC ratio: 77 %
Pre FEV6/FVC Ratio: 100 %
RV % PRED: 104 %
RV: 0.91 L
TLC % pred: 105 %
TLC: 4.38 L

## 2013-08-29 MED ORDER — FLUTTER DEVI: Status: DC

## 2013-08-29 MED ORDER — OMEPRAZOLE MAGNESIUM 20 MG PO TBEC: 20.0000 mg | DELAYED_RELEASE_TABLET | Freq: Every day | ORAL | Status: DC

## 2013-08-29 MED ORDER — FAMOTIDINE 20 MG PO TABS: 20.0000 mg | ORAL_TABLET | Freq: Every day | ORAL | Status: DC

## 2013-08-29 NOTE — Progress Notes (Signed)
Subjective:    Patient ID: Joyce Pena, female    DOB: Aug 21, 1995   MRN: [id number]   Brief patient profile:   18 yowf never smoker ? 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 Dx of kidnergarden of asthma and seemed better early HS then worse since 2013 ? Pollen in [place] may have made it worse then arrived in [place] last week in Aug 2014 and found couldn't tolerate the outdoors well due to heat and humidity  on no maint rx so referred to pulmonary clinic 04/04/13 by Dr Micheline Chapman.    History of Present Illness  04/04/2013 1st [facility] office visit/ [facility] cc daily symptoms pretty much all her life esp with exercise, no benefit to singulair,  ? advair helped ? Maybe not.  Much worse with viral illnesses in past and has to take a xopenex neb during these for  Severe cough and sob but otherwise just using the saba hfa. rec dulera 100 Take 2 puffs first thing in am and then another 2 puffs about 12 hours later - blow dulera out through nose  only use your albuterol (xopenox) as a rescue medication   Try prilosec 20mg   Take 30-60 min before first meal of the day and Pepcid 20 mg one bedtime until cough is completely gone for at least a week without the need for cough suppression GERD diet    05/09/2013 f/u ov/[facility] re: sob ? asthma Chief Complaint  Patient presents with  . Follow-up    Pt states that her breathing had improved, but has worsened slightly since weather turned colder. She has not had to use rescue inhaler since her last visit.   doe x can't walk 100 yards with back pack s giving out. Variably taking gerd rx for overt HB esp in pm Never wake up with cough or wheeze or need saba rec Dulera 100 Take 2 puffs first thing in am and then another 2 puffs about 12 hours later.  Only use your albuterol as a rescue medication Try prilosec 20mg   Take 30-60 min before first meal of the day and Pepcid 20 mg one bedtime until cough is completely gone for at least a week  without the need for cough suppression Please schedule a follow up office visit in 4 weeks, sooner if needed with pfts on return    08/29/2013 f/u ov/[facility] re:  Chief Complaint  Patient presents with  . Follow-up    Pt c/o increased cough x 2 days- prod with minimal sputum- ? color. She has not needed rescue inhaler.    no saba x sev weeks, last dulera x 2 weeks prior to OV   Taking pepcid only at hs Harsh barking cough day > night     No obvious patterns in day to day or daytime variabilty or assoc  cp or chest tightness, subjective wheeze overt sinus or hb symptoms. No unusual exp hx or h/o childhood pna/ asthma or knowledge of premature birth.  Sleeping ok without nocturnal  or early am exacerbation  of respiratory  c/o's or need for noct saba. Also denies any obvious fluctuation of symptoms with weather or environmental changes or other aggravating or alleviating factors except as outlined above   Current Medications, Allergies, Complete Past Medical History, Past Surgical History, Family History, and Social History were reviewed in [employer] record.  ROS  The following are not active complaints unless bolded sore throat, dysphagia, dental problems,  itching, sneezing,  nasal congestion or excess/ purulent secretions, ear ache,   fever, chills, sweats, unintended wt loss, pleuritic or exertional cp, hemoptysis,  orthopnea pnd or leg swelling, presyncope, palpitations, heartburn, abdominal pain, anorexia, nausea, vomiting, diarrhea  or change in bowel or urinary habits, change in stools or urine, dysuria,hematuria,  rash, arthralgias, visual complaints, headache, numbness weakness or ataxia or problems with walking or coordination,  change in mood/affect or memory.                    Objective:   Physical Exam  amb wf nad with harsh grinding cough   Wt Readings from Last 3 Encounters:  08/29/13 136 lb (61.689 kg) (66%*, Z = 0.40)  05/09/13 122 lb  (55.339 kg) (42%*, Z = -0.21)  04/13/13 123 lb 2 oz (55.849 kg) (45%*, Z = -0.14)   * Growth percentiles are based on CDC 20-20 Years data.           HEENT: nl dentition, turbinates, and orophanx. Nl external ear canals without cough reflex   NECK :  without JVD/Nodes/TM/ nl carotid upstrokes bilaterally   LUNGS: no acc muscle use, clear to A and P bilaterally without cough on insp or exp maneuvers   CV:  RRR  no s3 or murmur or increase in P2, no edema   ABD:  soft and nontender with nl excursion in the supine position. No bruits or organomegaly, bowel sounds nl  MS:  warm without deformities, calf tenderness, cyanosis or clubbing          Assessment & Plan:

## 2013-08-29 NOTE — Progress Notes (Signed)
PFT done today. 

## 2013-08-29 NOTE — Patient Instructions (Addendum)
Stop dulera as you do not appear to have chronic asthma   Try prilosec 20mg   Take 30-60 min before first meal of the day and Pepcid 20 mg one bedtime    The progesterone in your birth control pill may be contributing to non-acid gerd issues (for future consideration only)  Only use your albuterol as a rescue medication to be used if you can't catch your breath by resting or doing a relaxed purse lip breathing pattern.  - The less you use it, the better it will work when you need it. - Ok to use up to 2 puffs  every 4 hours if you must but call for immediate appointment if use goes up over your usual need - Don't leave home without it !!  (think of it like the spare tire for your car)   GERD (REFLUX)  is an extremely common cause of respiratory symptoms, many times with no significant heartburn at all.    It can be treated with medication, but also with lifestyle changes including avoidance of late meals, excessive alcohol, smoking cessation, and avoid fatty foods, chocolate, peppermint, colas, red wine, and acidic juices such as orange juice.  NO MINT OR MENTHOL PRODUCTS SO NO COUGH DROPS  USE SUGARLESS CANDY INSTEAD (jolley ranchers or Stover's)  NO OIL BASED VITAMINS - use powdered substitutes.  For cough mucinex dm up 1200 mg every 12 hours and use the flutter valve to prevent grinding sound as much as possible   Follow is as needed

## 2013-08-30 DIAGNOSIS — R058 Other specified cough: Secondary | ICD-10-CM | POA: Insufficient documentation

## 2013-08-30 DIAGNOSIS — R05 Cough: Secondary | ICD-10-CM | POA: Insufficient documentation

## 2013-08-30 NOTE — Assessment & Plan Note (Signed)
-   hfa 90% p coaching 04/04/13  - spirometry wnl 05/09/13 - pft.s 08/29/13 completely nl off all rx with active symptoms of cough   She may have an asthmatic component to her symptoms but not apparent by today's exams or pfts  See cough

## 2013-08-30 NOTE — Assessment & Plan Note (Signed)
Classic Upper airway cough syndrome, so named because it's frequently impossible to sort out how much is  CR/sinusitis with freq throat clearing (which can be related to primary GERD)   vs  causing  secondary (" extra esophageal")  GERD from wide swings in gastric pressure that occur with throat clearing, often  promoting self use of mint and menthol lozenges that reduce the lower esophageal sphincter tone and exacerbate the problem further in a cyclical fashion.   These are the same pts (now being labeled as having "irritable larynx syndrome" by some cough centers) who not infrequently have a history of having failed to tolerate ace inhibitors,  dry powder inhalers or biphosphonates or report having atypical reflux symptoms that don't respond to standard doses of PPI , and are easily confused as having aecopd or asthma flares by even experienced allergists/ pulmonologists.  For now try max gerd rx/ flutter valve and consider different form of birth control due to effects on LES  See instructions for specific recommendations which were reviewed directly with the patient who was given a copy with highlighter outlining the key components.

## 2013-09-06 ENCOUNTER — Ambulatory Visit (HOSPITAL_COMMUNITY)
Admission: EM | Admit: 2013-09-06 | Discharge: 2013-09-06 | Disposition: A | Discharge status: Home / Self Care | Payer: BC Managed Care – PPO | Source: Intra-hospital | Attending: Psychiatry | Admitting: Psychiatry

## 2013-09-06 HISTORY — DX: Depression, unspecified: F32.A

## 2013-09-06 HISTORY — DX: Anxiety disorder, unspecified: F41.9

## 2013-09-06 HISTORY — DX: Major depressive disorder, single episode, unspecified: F32.9

## 2013-09-07 ENCOUNTER — Emergency Department (HOSPITAL_COMMUNITY)
Admission: EM | Admit: 2013-09-07 | Discharge: 2013-09-07 | Disposition: A | Discharge status: Home / Self Care | Payer: BC Managed Care – PPO | Attending: Emergency Medicine | Admitting: Emergency Medicine

## 2013-09-07 ENCOUNTER — Encounter (HOSPITAL_COMMUNITY): Payer: Self-pay | Admitting: Emergency Medicine

## 2013-09-07 ENCOUNTER — Encounter (HOSPITAL_COMMUNITY): Payer: Self-pay | Admitting: *Deleted

## 2013-09-07 ENCOUNTER — Emergency Department (HOSPITAL_COMMUNITY): Payer: BC Managed Care – PPO

## 2013-09-07 DIAGNOSIS — Y939 Activity, unspecified: Secondary | ICD-10-CM | POA: Insufficient documentation

## 2013-09-07 DIAGNOSIS — S60519A Abrasion of unspecified hand, initial encounter: Secondary | ICD-10-CM

## 2013-09-07 DIAGNOSIS — IMO0002 Reserved for concepts with insufficient information to code with codable children: Secondary | ICD-10-CM | POA: Insufficient documentation

## 2013-09-07 DIAGNOSIS — J45909 Unspecified asthma, uncomplicated: Secondary | ICD-10-CM | POA: Insufficient documentation

## 2013-09-07 DIAGNOSIS — Z79899 Other long term (current) drug therapy: Secondary | ICD-10-CM | POA: Insufficient documentation

## 2013-09-07 DIAGNOSIS — S60221A Contusion of right hand, initial encounter: Secondary | ICD-10-CM

## 2013-09-07 DIAGNOSIS — Y929 Unspecified place or not applicable: Secondary | ICD-10-CM | POA: Insufficient documentation

## 2013-09-07 DIAGNOSIS — G8929 Other chronic pain: Secondary | ICD-10-CM | POA: Insufficient documentation

## 2013-09-07 DIAGNOSIS — S60229A Contusion of unspecified hand, initial encounter: Secondary | ICD-10-CM | POA: Insufficient documentation

## 2013-09-07 DIAGNOSIS — W2209XA Striking against other stationary object, initial encounter: Secondary | ICD-10-CM | POA: Insufficient documentation

## 2013-09-07 DIAGNOSIS — F988 Other specified behavioral and emotional disorders with onset usually occurring in childhood and adolescence: Secondary | ICD-10-CM | POA: Insufficient documentation

## 2013-09-07 DIAGNOSIS — K219 Gastro-esophageal reflux disease without esophagitis: Secondary | ICD-10-CM | POA: Insufficient documentation

## 2013-09-07 DIAGNOSIS — Z8639 Personal history of other endocrine, nutritional and metabolic disease: Secondary | ICD-10-CM | POA: Insufficient documentation

## 2013-09-07 DIAGNOSIS — Z862 Personal history of diseases of the blood and blood-forming organs and certain disorders involving the immune mechanism: Secondary | ICD-10-CM | POA: Insufficient documentation

## 2013-09-07 NOTE — ED Notes (Signed)
Pt is c/o right hand pain after punching a wall

## 2013-09-07 NOTE — ED Provider Notes (Signed)
Medical screening examination/treatment/procedure(s) were performed by non-physician practitioner and as supervising physician I was immediately available for consultation/collaboration.    Johnna Acosta, MD 09/07/13 [phone]

## 2013-09-07 NOTE — Discharge Instructions (Signed)
Your x-rays did not show any broken bones. Continue to use rest, ice, compression and elevation to reduce pain and swelling in her hand.   Contusion A contusion is a deep bruise. Contusions are the result of an injury that caused bleeding under the skin. The contusion may turn blue, purple, or yellow. Minor injuries will give you a painless contusion, but more severe contusions may stay painful and swollen for a few weeks.  CAUSES  A contusion is usually caused by a blow, trauma, or direct force to an area of the body. SYMPTOMS   Swelling and redness of the injured area.  Bruising of the injured area.  Tenderness and soreness of the injured area.  Pain. DIAGNOSIS  The diagnosis can be made by taking a history and physical exam. An X-ray, CT scan, or MRI may be needed to determine if there were any associated injuries, such as fractures. TREATMENT  Specific treatment will depend on what area of the body was injured. In general, the best treatment for a contusion is resting, icing, elevating, and applying cold compresses to the injured area. Over-the-counter medicines may also be recommended for pain control. Ask your caregiver what the best treatment is for your contusion. HOME CARE INSTRUCTIONS   Put ice on the injured area.  Put ice in a plastic bag.  Place a towel between your skin and the bag.  Leave the ice on for 15-20 minutes, 03-04 times a day.  Only take over-the-counter or prescription medicines for pain, discomfort, or fever as directed by your caregiver. Your caregiver may recommend avoiding anti-inflammatory medicines (aspirin, ibuprofen, and naproxen) for 48 hours because these medicines may increase bruising.  Rest the injured area.  If possible, elevate the injured area to reduce swelling. SEEK IMMEDIATE MEDICAL CARE IF:   You have increased bruising or swelling.  You have pain that is getting worse.  Your swelling or pain is not relieved with medicines. MAKE  SURE YOU:   Understand these instructions.  Will watch your condition.  Will get help right away if you are not doing well or get worse. Document Released: 04/28/2005 Document Revised: 10/11/2011 Document Reviewed: 05/24/2011 [facility] Patient Information 2014 [place], [place].

## 2013-09-07 NOTE — BH Assessment (Signed)
Assessment Note  Joyce Pena is a 18 y.o. female who presents as a walk-in to [facility], she is accompanied by her dormitory Mudlogger and friend.  Pt is a Museum/gallery exhibitions officer at [employer].  Pt reports the following: she is stressed out because of friend's problem with her boyfriend.  Pt is crying during interview(poor eye contact) and states that her friendship is "draining" her, says she has been "pulled into" her friend's relationship problems and she is not able to cope with her issues.  Pt says she "skyped" her boyfriend to talk to him about her problems and he ignored and she became frustrated.  Pt says she had SI thoughts--no plan or intent to harm self but to relieve the feelings of frustration, she punched the wall(visible bruising on right hand, knuckle and cuts).  Pt also cut herself on her left hip with a pocket knife.  Pt admits she has been cutting to relieve stress for the last 2 mos.  Pt is from [place], [place] and says it's been very difficult to make friends.  She has severe social anxiety and doesn't go out, remains mostly in her dorm room, completing class work--"I'm so alone as school".  Pt is contracting for safety, she has no past inpt admissions, no past SI attempts(thoughts only).  Pt endorses depressive sxs; excessive sleep, wt gain, crying spells, anhedonia and isolation.  Pt told this writer no longer had SI thoughts and really wanted to someone to talk to.  Pt was sent to [facility] for medical care due to hand injury.   Axis I: Anxiety Disorder NOS and Depressive Disorder NOS Axis II: Deferred Axis III:  Past Medical History  Diagnosis Date  . Asthma   . IBS (irritable bowel syndrome)   . ADD (attention deficit disorder)   . Hyperlipidemia   . GERD (gastroesophageal reflux disease)   . Chronic headaches   . Depression   . Anxiety    Axis IV: other psychosocial or environmental problems, problems related to social environment and problems with primary support group Axis V: 41-50 serious  symptoms  Past Medical History:  Past Medical History  Diagnosis Date  . Asthma   . IBS (irritable bowel syndrome)   . ADD (attention deficit disorder)   . Hyperlipidemia   . GERD (gastroesophageal reflux disease)   . Chronic headaches   . Depression   . Anxiety     Past Surgical History  Procedure Laterality Date  . Colonoscopy N/A 04/19/2013    Procedure: COLONOSCOPY;  Surgeon: Lafayette Dragon, MD;  Location: [facility];  Service: Endoscopy;  Laterality: N/A;    Family History:  Family History  Problem Relation Age of Onset  . Emphysema Paternal Grandfather   . Asthma Cousin   . Heart disease Father   . Breast cancer Paternal Grandmother   . Colon cancer Paternal Grandfather   . Diabetes Paternal Grandfather   . Heart disease Paternal Grandfather   . Heart disease Paternal Grandmother   . Irritable bowel syndrome Sister     Social History:  reports that she has never smoked. She has never used smokeless tobacco. She reports that she drinks alcohol. She reports that she does not use illicit drugs.  Additional Social History:  Alcohol / Drug Use Pain Medications: See MAR  Prescriptions: See MAR  Over the Counter: See MAR  History of alcohol / drug use?: Yes Longest period of sobriety (when/how long): Occasional   CIWA:   COWS:    Allergies:  Allergies  Allergen Reactions  . Banana Other (See Comments)    Mouth sores    Home Medications:  (Not in a hospital admission)  OB/GYN Status:  No LMP recorded.  General Assessment Data Location of Assessment: [facility] Is this a Tele or Face-to-Face Assessment?: Face-to-Face Is this an Initial Assessment or a Re-assessment for this encounter?: Initial Assessment Living Arrangements: Other (Comment) (Lives on campus ) Can pt return to current living arrangement?: Yes Admission Status: Voluntary Is patient capable of signing voluntary admission?: Yes Transfer from: [facility] Referral Source:  MD  Medical Screening Exam ([facility]) Medical Exam completed: No Reason for MSE not completed: Other: (None )  [place] Living Arrangements: Other (Comment) (Lives on campus ) Name of Psychiatrist: None  Name of Therapist: None   Education Status Is patient currently in school?: Yes Current Grade: Freshman  Highest grade of school patient has completed: 12th  Name of school: [employer] person: None   Risk to self Suicidal Ideation: No-Not Currently/Within Last 6 Months Suicidal Intent: No-Not Currently/Within Last 6 Months Is patient at risk for suicide?: No Suicidal Plan?: No-Not Currently/Within Last 6 Months Access to Means: No What has been your use of drugs/alcohol within the last 12 months?: Pt denies  Previous Attempts/Gestures: No How many times?: 0 Other Self Harm Risks: None  Triggers for Past Attempts: None known Intentional Self Injurious Behavior: None Family Suicide History: No Recent stressful life event(s): Conflict (Comment) (Relational with parents, friends, boyfriend ) Persecutory voices/beliefs?: No Depression: Yes Depression Symptoms: Tearfulness;Fatigue;Loss of interest in usual pleasures;Feeling worthless/self pity;Feeling angry/irritable Substance abuse history and/or treatment for substance abuse?: No Suicide prevention information given to non-admitted patients: Not applicable  Risk to Others Homicidal Ideation: No Thoughts of Harm to Others: No Current Homicidal Intent: No Current Homicidal Plan: No Access to Homicidal Means: No Identified Victim: Noe  History of harm to others?: No Assessment of Violence: In past 6-12 months Violent Behavior Description: Punched dorm room wall  Does patient have access to weapons?: No Criminal Charges Pending?: No Does patient have a court date: No  Psychosis Hallucinations: None noted Delusions: None noted  Mental Status Report Appear/Hygiene: Other (Comment)  (Appropriate ) Eye Contact: Poor Motor Activity: Unremarkable Speech: Logical/coherent;Rapid Level of Consciousness: Alert;Crying Mood: Depressed;Anxious;Sad Affect: Anxious;Depressed;Sad Anxiety Level: Minimal Thought Processes: Coherent;Relevant Judgement: Unimpaired Orientation: Person;Place;Time;Situation Obsessive Compulsive Thoughts/Behaviors: None  Cognitive Functioning Concentration: Normal Memory: Recent Intact;Remote Intact IQ: Average Insight: Fair Impulse Control: Fair Appetite: Good Weight Loss: 0 Weight Gain: 15 Sleep: Increased Total Hours of Sleep: 8 Vegetative Symptoms: Staying in bed  ADLScreening [facility] Assessment Services) Patient's cognitive ability adequate to safely complete daily activities?: Yes Patient able to express need for assistance with ADLs?: Yes Independently performs ADLs?: Yes (appropriate for developmental age)  Prior Inpatient Therapy Prior Inpatient Therapy: No Prior Therapy Dates: None  Prior Therapy Facilty/Provider(s): None  Reason for Treatment: None   Prior Outpatient Therapy Prior Outpatient Therapy: No Prior Therapy Dates: None  Prior Therapy Facilty/Provider(s): None  Reason for Treatment: None   ADL Screening (condition at time of admission) Patient's cognitive ability adequate to safely complete daily activities?: Yes Is the patient deaf or have difficulty hearing?: No Does the patient have difficulty seeing, even when wearing glasses/contacts?: No Does the patient have difficulty concentrating, remembering, or making decisions?: No Patient able to express need for assistance with ADLs?: Yes Does the patient have difficulty dressing or bathing?: No Independently performs  ADLs?: Yes (appropriate for developmental age) Does the patient have difficulty walking or climbing stairs?: No Weakness of Legs: None Weakness of Arms/Hands: None  Home Assistive Devices/Equipment Home Assistive Devices/Equipment: None  Therapy  Consults (therapy consults require a physician order) PT Evaluation Needed: No OT Evalulation Needed: No SLP Evaluation Needed: No Abuse/Neglect Assessment (Assessment to be complete while patient is alone) Physical Abuse: Denies Verbal Abuse: Denies Sexual Abuse: Denies Exploitation of patient/patient's resources: Denies Self-Neglect: Denies Values / Beliefs Cultural Requests During Hospitalization: None Spiritual Requests During Hospitalization: None Consults Spiritual Care Consult Needed: No Social Work Consult Needed: No Regulatory affairs officer (For Healthcare) Advance Directive: Patient does not have advance directive;Patient would not like information Pre-existing out of facility DNR order (yellow form or pink MOST form): No Nutrition Screen- [facility] Patient's home diet: Regular  Additional Information 1:1 In Past 12 Months?: No CIRT Risk: No Elopement Risk: No Does patient have medical clearance?: Yes     Disposition:  Disposition Initial Assessment Completed for this Encounter: Yes Disposition of Patient: Referred to;Outpatient treatment Type of outpatient treatment: Adult Patient referred to: Outpatient clinic referral;Other (Comment) ([facility] )  On Site Evaluation by:   Reviewed with Physician:    Polo Riley C 09/07/2013 1:17 AM

## 2013-09-07 NOTE — ED Provider Notes (Signed)
CSN: [id number]     Arrival date & time 09/07/13  0001 History   First MD Initiated Contact with Patient 09/07/13 0015     Chief Complaint  Patient presents with  . Hand Injury   HPI  History provided by the patient. The patient is a 18 year old female who presents with right hand injury. Patient reports being angrier than the day punched a wall. She has pain and some bruising to her knuckles. Pain is worse with some movements and palpation over the area. She has not used any medications to treat her symptoms. Denies any other injuries or complaints. No weakness or numbness. No other aggravating or alleviating factors. No other associated symptoms.   Past Medical History  Diagnosis Date  . Asthma   . IBS (irritable bowel syndrome)   . ADD (attention deficit disorder)   . Hyperlipidemia   . GERD (gastroesophageal reflux disease)   . Chronic headaches    Past Surgical History  Procedure Laterality Date  . Colonoscopy N/A 04/19/2013    Procedure: COLONOSCOPY;  Surgeon: Lafayette Dragon, MD;  Location: [facility];  Service: Endoscopy;  Laterality: N/A;   Family History  Problem Relation Age of Onset  . Emphysema Paternal Grandfather   . Asthma Cousin   . Heart disease Father   . Breast cancer Paternal Grandmother   . Colon cancer Paternal Grandfather   . Diabetes Paternal Grandfather   . Heart disease Paternal Grandfather   . Heart disease Paternal Grandmother   . Irritable bowel syndrome Sister    History  Substance Use Topics  . Smoking status: Never Smoker   . Smokeless tobacco: Never Used  . Alcohol Use: Yes     Comment: rare   OB History   Grav Para Term Preterm Abortions TAB SAB Ect Mult Living                 Review of Systems  Neurological: Negative for weakness and numbness.  All other systems reviewed and are negative.    Allergies  Banana  Home Medications   Current Outpatient Rx  Name  Route  Sig  Dispense  Refill  . amphetamine-dextroamphetamine  (ADDERALL) 5 MG tablet   Oral   Take 5 mg by mouth daily as needed (attention).          Marland Kitchen aspirin-acetaminophen-caffeine (EXCEDRIN MIGRAINE) 250-250-65 MG per tablet   Oral   Take 1 tablet by mouth every 6 (six) hours as needed for pain.         . famotidine (PEPCID) 20 MG tablet   Oral   Take 1 tablet (20 mg total) by mouth at bedtime.   30 tablet   11   . levalbuterol (XOPENEX HFA) 45 MCG/ACT inhaler   Inhalation   Inhale 1-2 puffs into the lungs every 4 (four) hours as needed for wheezing or shortness of breath.   1 Inhaler   11   . lisdexamfetamine (VYVANSE) 40 MG capsule   Oral   Take 40 mg by mouth every morning.         . Norethindrone Acetate-Ethinyl Estrad-FE (LOMEDIA 24 FE) 1-20 MG-MCG(24) tablet   Oral   Take 1 tablet by mouth daily.         . nortriptyline (PAMELOR) 25 MG capsule   Oral   Take 25 mg by mouth daily.         Marland Kitchen omeprazole (PRILOSEC OTC) 20 MG tablet   Oral   Take 1 tablet (20 mg  total) by mouth daily.   30 tablet   11   . Respiratory Therapy Supplies (FLUTTER) DEVI      Use as directed   1 each   0    BP 145/88  Pulse 85  Temp(Src) 97.6 F (36.4 C) (Oral)  Resp 16  SpO2 100%  LMP 08/26/2013 Physical Exam  Nursing note and vitals reviewed. Constitutional: She is oriented to person, place, and time. She appears well-developed and well-nourished. No distress.  HENT:  Head: Normocephalic.  Cardiovascular: Normal rate and regular rhythm.   Pulmonary/Chest: Effort normal and breath sounds normal.  Abdominal: Soft.  Musculoskeletal: Normal range of motion.  Range of motion of the right hand and fingers. Normal grip strength. There is some superficial abrasions over the fourth MCP joint. Bruising and mild swelling over the third MCP joint. No gross deformities. No deformities of the metacarpal bones. Normal distal sensation and capillary refill less than 2 seconds.  Neurological: She is alert and oriented to person, place, and  time.  Skin: Skin is warm and dry. No rash noted.  Psychiatric: She has a normal mood and affect. Her behavior is normal.    ED Course  Procedures   DIAGNOSTIC STUDIES: Oxygen Saturation is 100% on room air.    COORDINATION OF CARE:  Nursing notes reviewed. Vital signs reviewed. Initial pt interview and examination performed.   12:25 AM-patient seen and evaluated. She is well-appearing no acute distress. Discussed work up plan with pt at bedside, which includes hand x-rays. Pt agrees with plan.  X-rays reviewed. No fractures or other concerning injury.  Imaging Review Dg Hand Complete Right  09/07/2013   CLINICAL DATA:  Right hand injury, pain.  EXAM: RIGHT HAND - COMPLETE 3+ VIEW  COMPARISON:  None.  FINDINGS: Imaged bones, joints and soft tissues appear normal.  IMPRESSION: Negative exam.   Electronically Signed   By: Inge Rise M.D.   On: 09/07/2013 00:47      MDM   1. Contusion of hand, right   2. Abrasion of hand        Martie Lee, PA-C 09/07/13 8101

## 2014-05-02 DIAGNOSIS — K623 Rectal prolapse: Secondary | ICD-10-CM

## 2014-05-02 HISTORY — DX: Rectal prolapse: K62.3

## 2014-06-13 ENCOUNTER — Other Ambulatory Visit: Payer: Self-pay | Admitting: Internal Medicine

## 2014-06-16 ENCOUNTER — Encounter (HOSPITAL_COMMUNITY): Payer: Self-pay | Admitting: *Deleted

## 2014-06-16 ENCOUNTER — Emergency Department (HOSPITAL_COMMUNITY)
Admission: EM | Admit: 2014-06-16 | Discharge: 2014-06-17 | Disposition: A | Discharge status: Home / Self Care | Payer: BC Managed Care – PPO | Attending: Emergency Medicine | Admitting: Emergency Medicine

## 2014-06-16 DIAGNOSIS — F909 Attention-deficit hyperactivity disorder, unspecified type: Secondary | ICD-10-CM | POA: Diagnosis not present

## 2014-06-16 DIAGNOSIS — Z7982 Long term (current) use of aspirin: Secondary | ICD-10-CM | POA: Diagnosis not present

## 2014-06-16 DIAGNOSIS — K625 Hemorrhage of anus and rectum: Secondary | ICD-10-CM | POA: Diagnosis present

## 2014-06-16 DIAGNOSIS — Z3202 Encounter for pregnancy test, result negative: Secondary | ICD-10-CM | POA: Insufficient documentation

## 2014-06-16 DIAGNOSIS — K219 Gastro-esophageal reflux disease without esophagitis: Secondary | ICD-10-CM | POA: Insufficient documentation

## 2014-06-16 DIAGNOSIS — Z79899 Other long term (current) drug therapy: Secondary | ICD-10-CM | POA: Insufficient documentation

## 2014-06-16 DIAGNOSIS — N939 Abnormal uterine and vaginal bleeding, unspecified: Secondary | ICD-10-CM

## 2014-06-16 DIAGNOSIS — F329 Major depressive disorder, single episode, unspecified: Secondary | ICD-10-CM | POA: Insufficient documentation

## 2014-06-16 DIAGNOSIS — J45909 Unspecified asthma, uncomplicated: Secondary | ICD-10-CM | POA: Diagnosis not present

## 2014-06-16 DIAGNOSIS — Z8639 Personal history of other endocrine, nutritional and metabolic disease: Secondary | ICD-10-CM | POA: Insufficient documentation

## 2014-06-16 HISTORY — DX: Rectal prolapse: K62.3

## 2014-06-16 LAB — PREGNANCY, URINE: Preg Test, Ur: NEGATIVE

## 2014-06-16 NOTE — ED Notes (Signed)
Pt noticed some rectal bleeding just prior to coming to the ER. Pt says she urinated as usual and noticed bright red blood on the tissue paper when she wiped. She says she noticed NO blood in the toilet but admits to having cramping sensation in her anal area.  Bronson Curb

## 2014-06-16 NOTE — ED Provider Notes (Signed)
CSN: [id number]     Arrival date & time 06/16/14  2257 History   First MD Initiated Contact with Patient 06/16/14 2325     This chart was scribed for non-physician practitioner, Charlann Lange PA-C, working with Kalman Drape, MD by Forrestine Him, ED Scribe. This patient was seen in room WA20/WA20 and the patient's care was started at 11:33 PM.   Chief Complaint  Patient presents with  . Rectal Bleeding   HPI  HPI Comments: Joyce Pena is a 18 y.o. female with a PMHx of IBS, hyperlipidemia, GERD, and rectal prolapse who presents to the Emergency Department complaining of intermittent, mild rectal bleeding onset prior to arrival. She also reports "cramping" to her her rectal region. Pt states she noticed blood on the toilet paper from her rectum after urinating only.  She denies currently menstruating. Pt admits to a long history of rectal prolapse and states she prolapses with each bowel movement. No history of rectal surgery. However, she is awaiting test results from her rectal surgeon for future plan of care. No other associated symptoms at this time. LNMP 2 weeks ago. [name] is currently on birth control and denies missing any doses. No known allergies to medications. No other concerns this visit.  Past Medical History  Diagnosis Date  . Asthma   . IBS (irritable bowel syndrome)   . ADD (attention deficit disorder)   . Hyperlipidemia   . GERD (gastroesophageal reflux disease)   . Chronic headaches   . Depression   . Anxiety   . Rectal prolapse 05/2014   Past Surgical History  Procedure Laterality Date  . Colonoscopy N/A 04/19/2013    Procedure: COLONOSCOPY;  Surgeon: Lafayette Dragon, MD;  [facility]: [facility];  Service: Endoscopy;  Laterality: N/A;   Family History  Problem Relation Age of Onset  . Emphysema Paternal Grandfather   . Asthma Cousin   . Heart disease Father   . Breast cancer Paternal Grandmother   . Colon cancer Paternal Grandfather   . Diabetes Paternal  Grandfather   . Heart disease Paternal Grandfather   . Heart disease Paternal Grandmother   . Irritable bowel syndrome Sister    History  Substance Use Topics  . Smoking status: Never Smoker   . Smokeless tobacco: Never Used  . Alcohol Use: Yes     Comment: rare   OB History    No data available     Review of Systems  Gastrointestinal: Positive for anal bleeding and rectal pain.  All other systems reviewed and are negative.     Allergies  Banana  Home Medications   Prior to Admission medications   Medication Sig Start Date End Date Taking? Authorizing Provider  amphetamine-dextroamphetamine (ADDERALL) 5 MG tablet Take 5 mg by mouth daily as needed (attention).     Historical Provider, MD  aspirin-acetaminophen-caffeine (EXCEDRIN MIGRAINE) [phone] MG per tablet Take 1 tablet by mouth every 6 (six) hours as needed for pain.    Historical Provider, MD  famotidine (PEPCID) 20 MG tablet Take 1 tablet (20 mg total) by mouth at bedtime. 08/29/13   Tanda Rockers, MD  levalbuterol [facility] HFA) 45 MCG/ACT inhaler Inhale 1-2 puffs into the lungs every 4 (four) hours as needed for wheezing or shortness of breath. 04/04/13   Tanda Rockers, MD  lisdexamfetamine (VYVANSE) 40 MG capsule Take 40 mg by mouth every morning.    Historical Provider, MD  Norethindrone Acetate-Ethinyl Estrad-FE (LOMEDIA 24 FE) 1-20 MG-MCG(24) tablet Take 1  tablet by mouth daily.    Historical Provider, MD  nortriptyline (PAMELOR) 25 MG capsule Take 25 mg by mouth daily.    Historical Provider, MD  omeprazole (PRILOSEC OTC) 20 MG tablet Take 1 tablet (20 mg total) by mouth daily. 08/29/13   Tanda Rockers, MD  omeprazole (PRILOSEC) 20 MG capsule TAKE ONE CAPSULE BY MOUTH EVERY DAY 06/13/14   Tanda Rockers, MD   Triage Vitals: BP 127/85 mmHg  Temp(Src) 98.5 F (36.9 C) (Oral)  Ht 4\' 9"  (1.448 m)  Wt 125 lb (56.7 kg)  BMI 27.04 kg/m2  SpO2 98%  LMP 06/08/2014   Physical Exam  Constitutional: She is  oriented to person, place, and time. She appears well-developed and well-nourished.  HENT:  Head: Normocephalic.  Eyes: EOM are normal.  Neck: Normal range of motion.  Pulmonary/Chest: Effort normal.  Abdominal: She exhibits no distension.  Genitourinary:  No rectal prolapse, or rectal bleeding visualized. There is vaginal bleeding without discharge. Minimally tender through pelvis.   Musculoskeletal: Normal range of motion.  Neurological: She is alert and oriented to person, place, and time.  Psychiatric: She has a normal mood and affect.  Nursing note and vitals reviewed.   ED Course  Procedures (including critical care time)  DIAGNOSTIC STUDIES: Oxygen Saturation is 98% on RA, Normal by my interpretation.    COORDINATION OF CARE: 11:31 PM- Will order urinalysis, pregnancy urine, CBC, and BMP. Discussed treatment plan with pt at bedside and pt agreed to plan.     Labs Review Labs Reviewed  CBC  BASIC METABOLIC PANEL  URINALYSIS, ROUTINE W REFLEX MICROSCOPIC  PREGNANCY, URINE   Results for orders placed or performed during the hospital encounter of 06/16/14  Wet prep, genital  Result Value Ref Range   Yeast Wet Prep HPF POC NONE SEEN NONE SEEN   Trich, Wet Prep NONE SEEN NONE SEEN   Clue Cells Wet Prep HPF POC NONE SEEN NONE SEEN   WBC, Wet Prep HPF POC MANY (A) NONE SEEN  CBC  Result Value Ref Range   WBC 9.3 4.0 - 10.5 K/uL   RBC 4.56 3.87 - 5.11 MIL/uL   Hemoglobin 13.3 12.0 - 15.0 g/dL   HCT 39.7 36.0 - 46.0 %   MCV 87.1 78.0 - 100.0 fL   MCH 29.2 26.0 - 34.0 pg   MCHC 33.5 30.0 - 36.0 g/dL   RDW 12.9 11.5 - 15.5 %   Platelets 268 150 - 400 K/uL  Basic metabolic panel  Result Value Ref Range   Sodium 140 137 - 147 mEq/L   Potassium 3.8 3.7 - 5.3 mEq/L   Chloride 105 96 - 112 mEq/L   CO2 22 19 - 32 mEq/L   Glucose, Bld 107 (H) 70 - 99 mg/dL   BUN 4 (L) 6 - 23 mg/dL   Creatinine, Ser 0.68 0.50 - 1.10 mg/dL   Calcium 9.4 8.4 - 10.5 mg/dL   GFR calc non Af  Amer >90 >90 mL/min   GFR calc Af Amer >90 >90 mL/min   Anion gap 13 5 - 15  Urinalysis, Routine w reflex microscopic  Result Value Ref Range   Color, Urine AMBER (A) YELLOW   APPearance CLOUDY (A) CLEAR   Specific Gravity, Urine 1.020 1.005 - 1.030   pH 6.0 5.0 - 8.0   Glucose, UA NEGATIVE NEGATIVE mg/dL   Hgb urine dipstick LARGE (A) NEGATIVE   Bilirubin Urine NEGATIVE NEGATIVE   Ketones, ur NEGATIVE NEGATIVE mg/dL  Protein, ur 30 (A) NEGATIVE mg/dL   Urobilinogen, UA 0.2 0.0 - 1.0 mg/dL   Nitrite NEGATIVE NEGATIVE   Leukocytes, UA SMALL (A) NEGATIVE  Pregnancy, urine  Result Value Ref Range   Preg Test, Ur NEGATIVE NEGATIVE  Urine microscopic-add on  Result Value Ref Range   Squamous Epithelial / LPF FEW (A) RARE   WBC, UA 0-2 <3 WBC/hpf   RBC / HPF 3-6 <3 RBC/hpf   Bacteria, UA MANY (A) RARE   Urine-Other MUCOUS PRESENT     Imaging Review No results found.   EKG Interpretation None      MDM   Final diagnoses:  None    1. Abnormal vaginal bleeding.  VSS, no decreased hgb. No evidence of infection, negative pregnancy test. Patient is stable for discharge home with GYN referrals for further evaluation of irregular bleeding.   I personally performed the services described in this documentation, which was scribed in my presence. The recorded information has been reviewed and is accurate.    Dewaine Oats, PA-C 06/17/14 [place], MD 06/17/14 [phone]

## 2014-06-17 LAB — BASIC METABOLIC PANEL
ANION GAP: 13 (ref 5–15)
BUN: 4 mg/dL — ABNORMAL LOW (ref 6–23)
CALCIUM: 9.4 mg/dL (ref 8.4–10.5)
CO2: 22 mEq/L (ref 19–32)
CREATININE: 0.68 mg/dL (ref 0.50–1.10)
Chloride: 105 mEq/L (ref 96–112)
Glucose, Bld: 107 mg/dL — ABNORMAL HIGH (ref 70–99)
Potassium: 3.8 mEq/L (ref 3.7–5.3)
Sodium: 140 mEq/L (ref 137–147)

## 2014-06-17 LAB — URINE MICROSCOPIC-ADD ON

## 2014-06-17 LAB — CBC
HCT: 39.7 % (ref 36.0–46.0)
Hemoglobin: 13.3 g/dL (ref 12.0–15.0)
MCH: 29.2 pg (ref 26.0–34.0)
MCHC: 33.5 g/dL (ref 30.0–36.0)
MCV: 87.1 fL (ref 78.0–100.0)
PLATELETS: 268 10*3/uL (ref 150–400)
RBC: 4.56 MIL/uL (ref 3.87–5.11)
RDW: 12.9 % (ref 11.5–15.5)
WBC: 9.3 10*3/uL (ref 4.0–10.5)

## 2014-06-17 LAB — URINALYSIS, ROUTINE W REFLEX MICROSCOPIC
BILIRUBIN URINE: NEGATIVE
Glucose, UA: NEGATIVE mg/dL
KETONES UR: NEGATIVE mg/dL
NITRITE: NEGATIVE
Protein, ur: 30 mg/dL — AB
Specific Gravity, Urine: 1.02 (ref 1.005–1.030)
UROBILINOGEN UA: 0.2 mg/dL (ref 0.0–1.0)
pH: 6 (ref 5.0–8.0)

## 2014-06-17 LAB — WET PREP, GENITAL
Clue Cells Wet Prep HPF POC: NONE SEEN
TRICH WET PREP: NONE SEEN
YEAST WET PREP: NONE SEEN

## 2014-06-17 NOTE — Discharge Instructions (Signed)
Abnormal Uterine Bleeding Abnormal uterine bleeding can affect women at various stages in life, including teenagers, women in their reproductive years, pregnant women, and women who have reached menopause. Several kinds of uterine bleeding are considered abnormal, including:  Bleeding or spotting between periods.   Bleeding after sexual intercourse.   Bleeding that is heavier or more than normal.   Periods that last longer than usual.  Bleeding after menopause.  Many cases of abnormal uterine bleeding are minor and simple to treat, while others are more serious. Any type of abnormal bleeding should be evaluated by your health care provider. Treatment will depend on the cause of the bleeding. HOME CARE INSTRUCTIONS Monitor your condition for any changes. The following actions may help to alleviate any discomfort you are experiencing:  Avoid the use of tampons and douches as directed by your health care provider.  Change your pads frequently. You should get regular pelvic exams and Pap tests. Keep all follow-up appointments for diagnostic tests as directed by your health care provider.  SEEK MEDICAL CARE IF:   Your bleeding lasts more than 1 week.   You feel dizzy at times.  SEEK IMMEDIATE MEDICAL CARE IF:   You pass out.   You are changing pads every 15 to 30 minutes.   You have abdominal pain.  You have a fever.   You become sweaty or weak.   You are passing large blood clots from the vagina.   You start to feel nauseous and vomit. MAKE SURE YOU:   Understand these instructions.  Will watch your condition.  Will get help right away if you are not doing well or get worse. Document Released: 07/19/2005 Document Revised: 07/24/2013 Document Reviewed: 02/15/2013 Exit[employer] Patient Information 2015 [employer]. This information is not intended to replace advice given to you by your health care provider. Make sure you discuss any questions you have with your  health care provider.  

## 2014-06-18 LAB — GC/CHLAMYDIA PROBE AMP
CT PROBE, AMP APTIMA: NEGATIVE
GC Probe RNA: NEGATIVE

## 2014-07-15 ENCOUNTER — Other Ambulatory Visit: Payer: Self-pay | Admitting: Internal Medicine

## 2015-01-05 IMAGING — CR DG HAND COMPLETE 3+V*R*
3 series · 3 of 3 positions shown · non-contrast
Comparison: None.

CLINICAL DATA: Right hand injury, pain.

EXAM:
RIGHT HAND - COMPLETE 3+ VIEW

[x hand pa right]
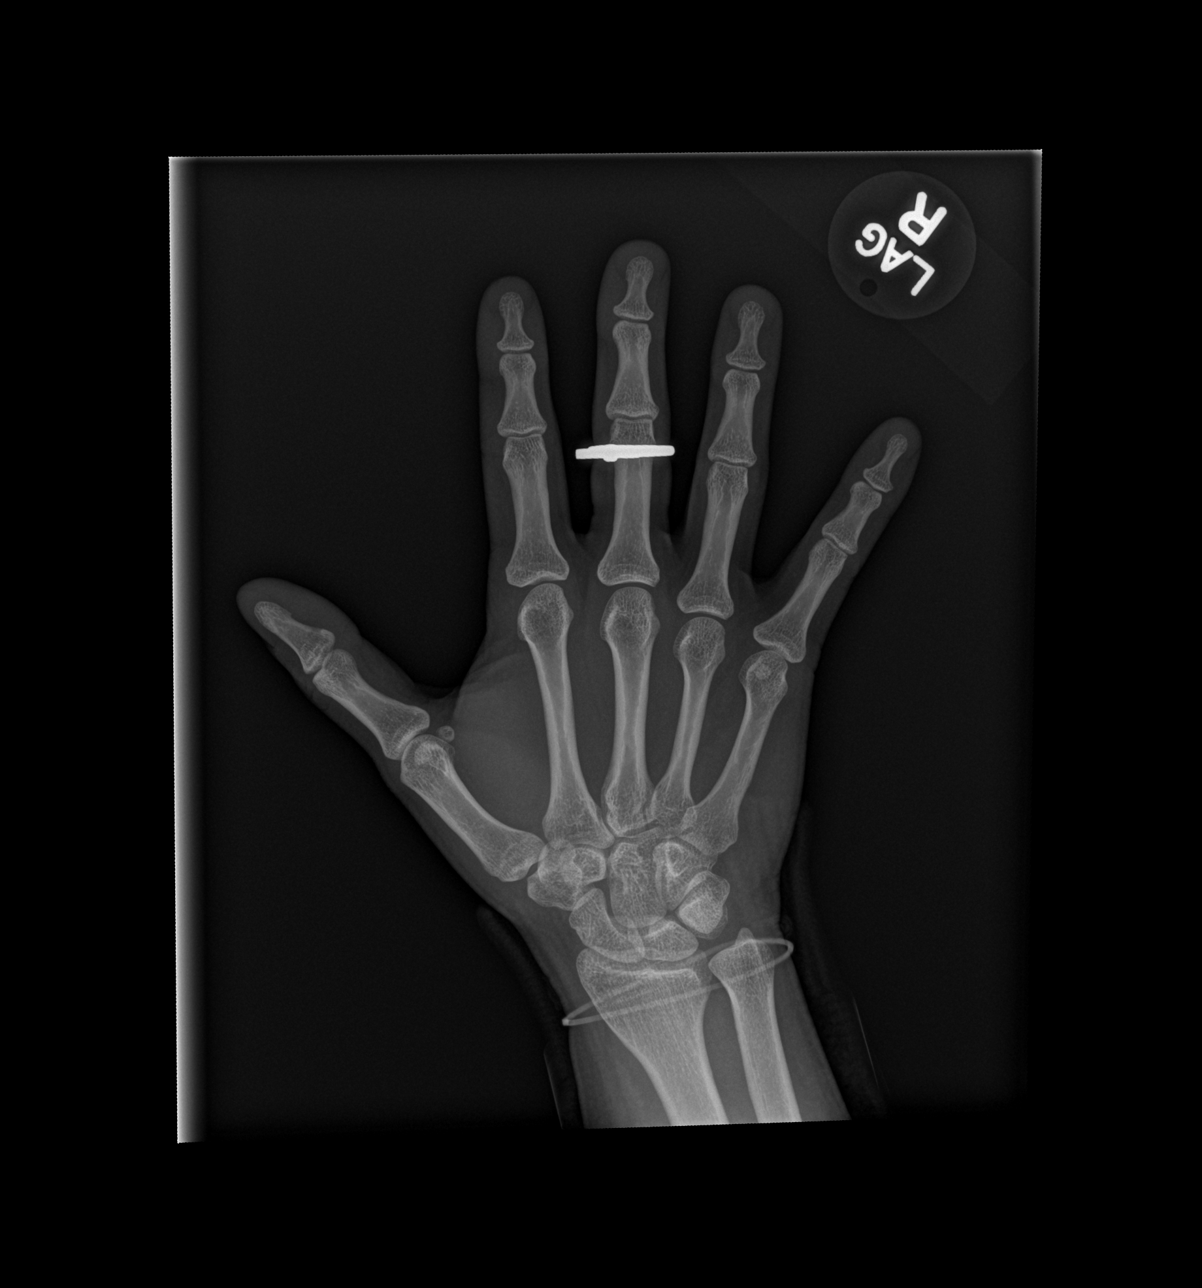

[x hand obl right]
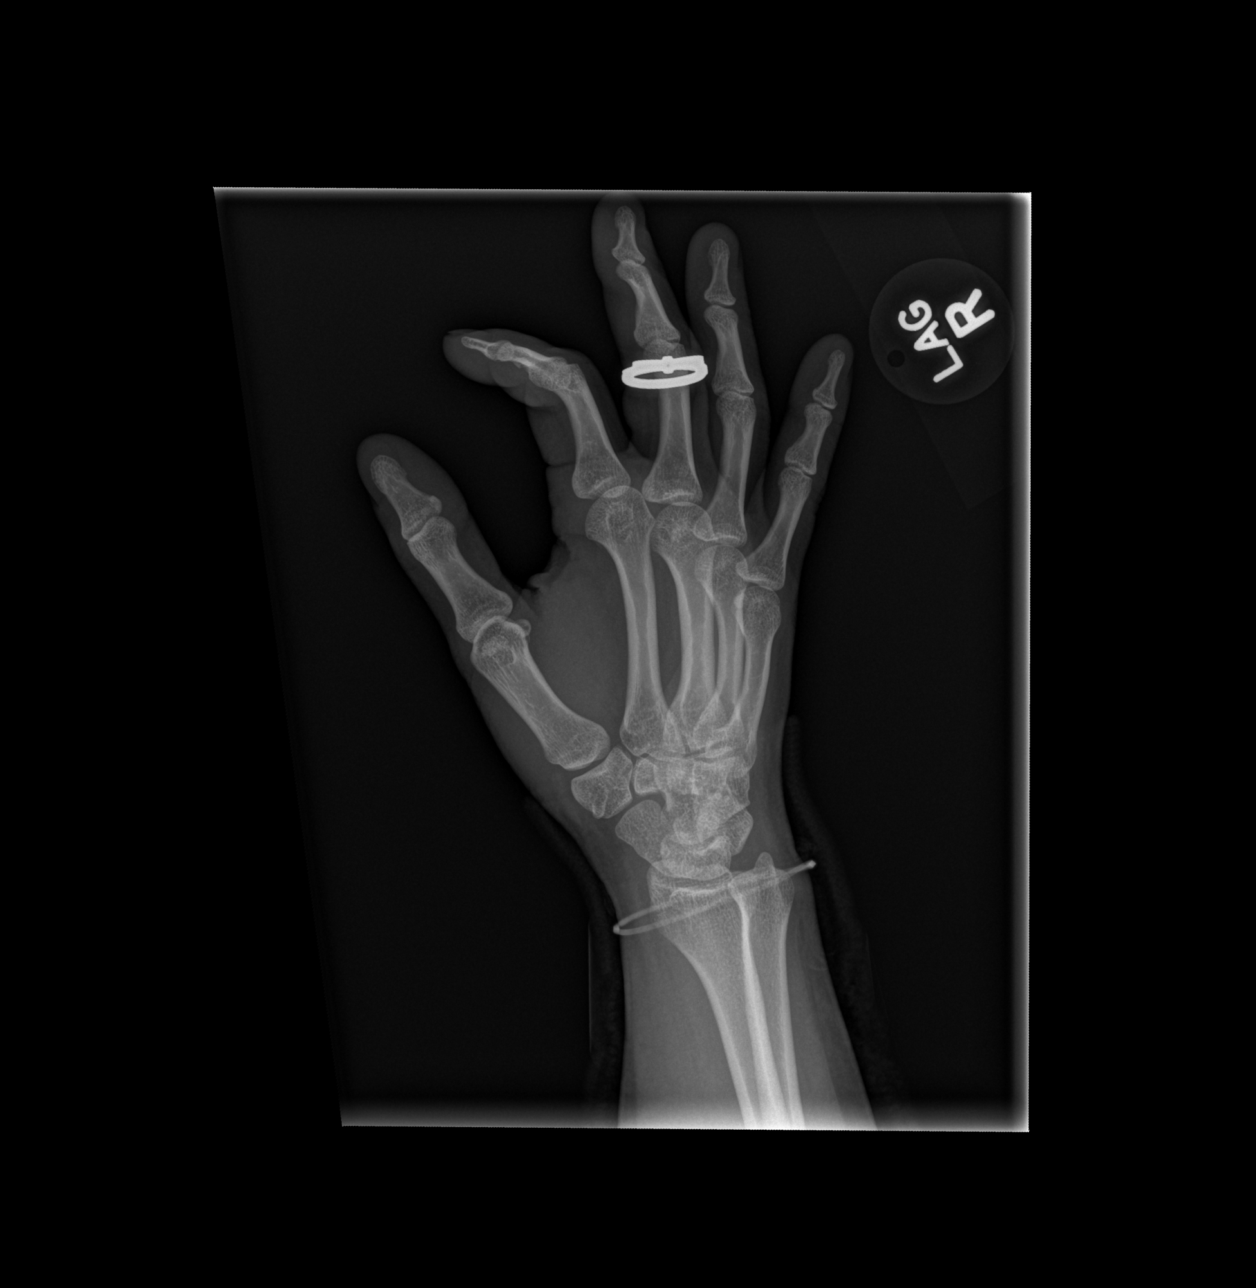

[x hand lat right]
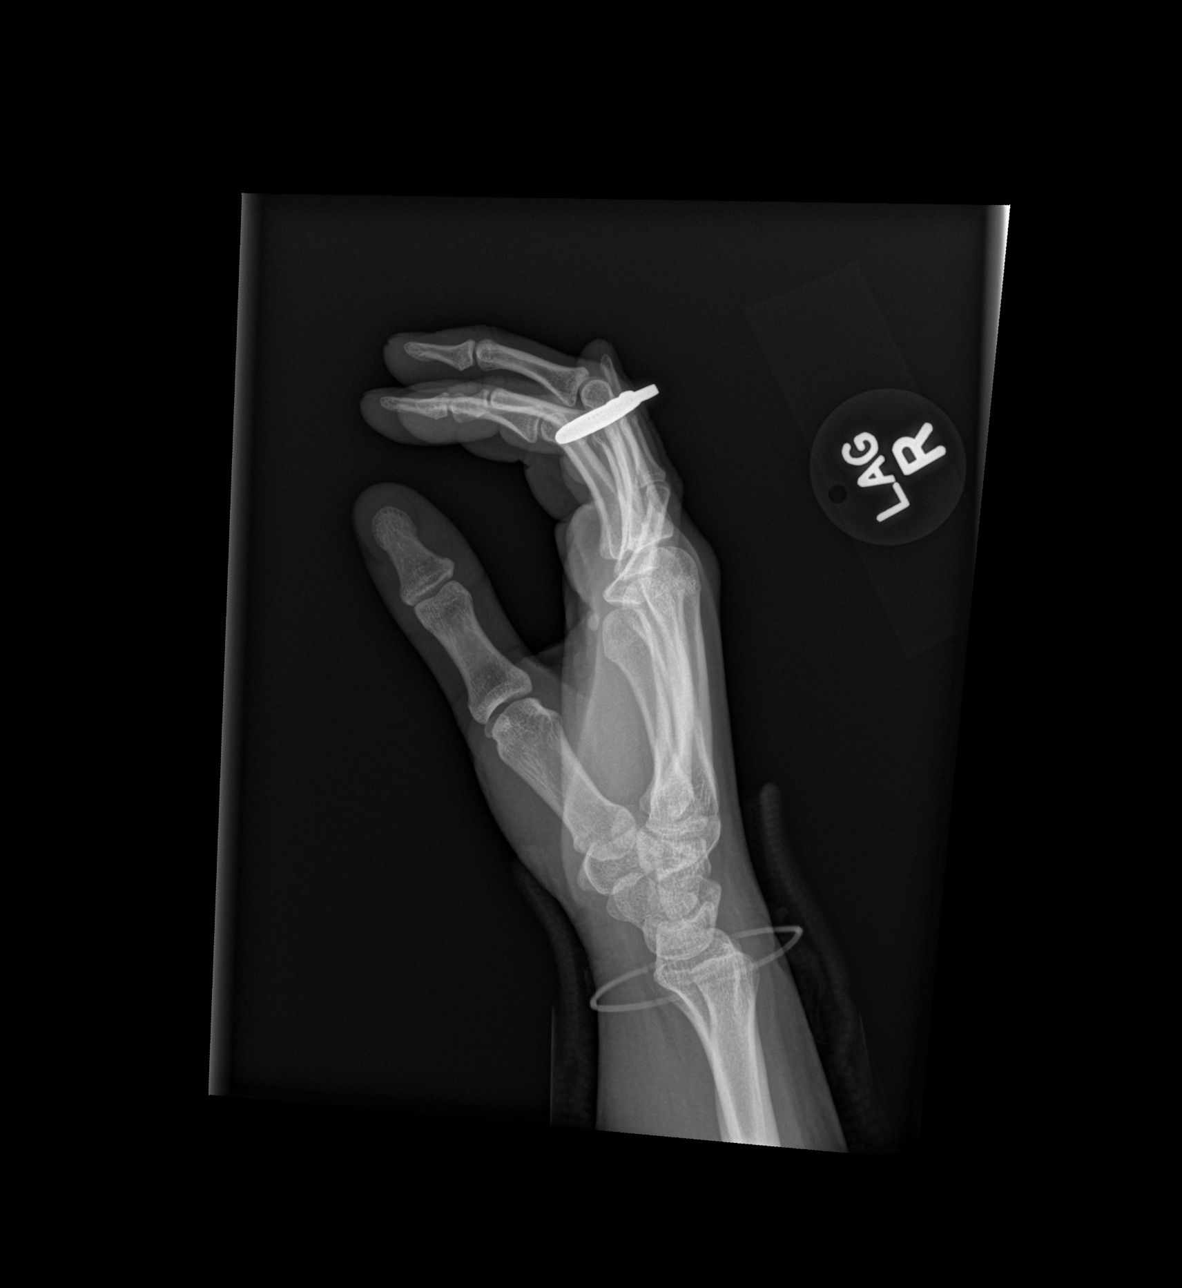

[3 of 3 positions shown; findings below may reference images not displayed]

FINDINGS: Imaged bones, joints and soft tissues appear normal.
IMPRESSION: Negative exam.

## 2015-08-03 DIAGNOSIS — F603 Borderline personality disorder: Secondary | ICD-10-CM

## 2015-08-03 DIAGNOSIS — F319 Bipolar disorder, unspecified: Secondary | ICD-10-CM

## 2015-08-03 HISTORY — DX: Borderline personality disorder: F60.3

## 2015-08-03 HISTORY — DX: Bipolar disorder, unspecified: F31.9

## 2015-09-10 ENCOUNTER — Encounter: Payer: Self-pay | Admitting: Cardiovascular Disease

## 2015-09-16 ENCOUNTER — Ambulatory Visit: Payer: Self-pay | Admitting: Interventional Cardiology

## 2015-09-19 ENCOUNTER — Ambulatory Visit: Payer: Self-pay | Admitting: Cardiovascular Disease

## 2015-10-10 ENCOUNTER — Ambulatory Visit: Payer: Self-pay | Admitting: Cardiovascular Disease

## 2015-11-05 ENCOUNTER — Ambulatory Visit (INDEPENDENT_AMBULATORY_CARE_PROVIDER_SITE_OTHER): Payer: BLUE CROSS/BLUE SHIELD | Admitting: Cardiovascular Disease

## 2015-11-05 ENCOUNTER — Encounter: Payer: Self-pay | Admitting: Cardiovascular Disease

## 2015-11-05 VITALS — BP 116/76 | HR 67 | Ht <= 58 in | Wt 114.8 lb

## 2015-11-05 DIAGNOSIS — E785 Hyperlipidemia, unspecified: Secondary | ICD-10-CM | POA: Diagnosis not present

## 2015-11-05 LAB — COMPREHENSIVE METABOLIC PANEL
ALT: 14 U/L (ref 6–29)
AST: 18 U/L (ref 10–30)
Albumin: 4.1 g/dL (ref 3.6–5.1)
Alkaline Phosphatase: 60 U/L (ref 33–115)
BUN: 10 mg/dL (ref 7–25)
CHLORIDE: 104 mmol/L (ref 98–110)
CO2: 25 mmol/L (ref 20–31)
Calcium: 9.5 mg/dL (ref 8.6–10.2)
Creat: 0.67 mg/dL (ref 0.50–1.10)
Glucose, Bld: 90 mg/dL (ref 65–99)
POTASSIUM: 4.7 mmol/L (ref 3.5–5.3)
Sodium: 137 mmol/L (ref 135–146)
TOTAL PROTEIN: 6.7 g/dL (ref 6.1–8.1)
Total Bilirubin: 0.3 mg/dL (ref 0.2–1.2)

## 2015-11-05 LAB — LIPID PANEL
CHOL/HDL RATIO: 2.8 ratio (ref <=5.0)
Cholesterol: 216 mg/dL — ABNORMAL HIGH (ref 125–200)
HDL: 76 mg/dL (ref >=46)
LDL CALC: 118 mg/dL (ref <130)
TRIGLYCERIDES: 112 mg/dL (ref <150)
VLDL: 22 mg/dL (ref <30)

## 2015-11-05 NOTE — Patient Instructions (Signed)

## 2015-11-05 NOTE — Progress Notes (Signed)
Cardiology Office Note   Date:  11/05/2015   ID:  Joyce Pena, DOB 12/09/1995, MRN [id number]  PCP:  PROVIDER NOT IN SYSTEM  Cardiologist:   Sabah Zucco, Wonda Cheng, MD   Chief Complaint  Patient presents with  . Hyperlipidemia   Problem Lost 1. Hyperlipidemia 2. Bipolar disease 3. Strong family hx of CAD    History of Present Illness: Joyce Pena is a 20 y.o. female who presents for evaluation of hyperlipidemia Seen with fiance - Christian.   Has tried eating a vegan diet - levels were still very high - total chol ~ 210, LDL 118 A student at [employer].    Admits that she does not eat well.    Eggs, processed foods.  Fried foods.  Does not have a lot of time.  Does not get any regular exercise -     Past Medical History  Diagnosis Date  . Asthma   . IBS (irritable bowel syndrome)   . ADD (attention deficit disorder)   . Hyperlipidemia   . GERD (gastroesophageal reflux disease)   . Chronic headaches   . Depression   . Anxiety   . Rectal prolapse 05/2014  . Bipolar disorder (manic depression) ([place]) 2017  . Borderline personality disorder 2017    Past Surgical History  Procedure Laterality Date  . Colonoscopy N/A 04/19/2013    Procedure: COLONOSCOPY;  Surgeon: Lafayette Dragon, MD;  Location: [facility];  Service: Endoscopy;  Laterality: N/A;  . Rectum surgery      HAD A RECTAL PROLAPSE     Current Outpatient Prescriptions  Medication Sig Dispense Refill  . amphetamine-dextroamphetamine (ADDERALL) 5 MG tablet Take 10 mg by mouth daily as needed (attention). 10-15MG  DEPENDING ON HOW SHE FEELS    . aspirin-acetaminophen-caffeine (EXCEDRIN MIGRAINE) 250-250-65 MG per tablet Take 1 tablet by mouth every 6 (six) hours as needed for pain.    [name] FE 1/20 1-20 MG-MCG tablet Take 1 tablet by mouth daily.    Marland Kitchen levalbuterol (XOPENEX HFA) 45 MCG/ACT inhaler Inhale 1-2 puffs into the lungs every 4 (four) hours as needed for wheezing or shortness of breath. 1  Inhaler 11  . lisdexamfetamine (VYVANSE) 60 MG capsule Take 60 mg by mouth daily.    Marland Kitchen omeprazole (PRILOSEC) 40 MG capsule Take 40 mg by mouth daily.     No current facility-administered medications for this visit.    Allergies:   Banana    Social History:  The patient  reports that she has never smoked. She has never used smokeless tobacco. She reports that she drinks alcohol. She reports that she does not use illicit drugs.   Family History:  The patient's family history includes Asthma in her cousin; Breast cancer in her paternal grandmother; Colon cancer in her paternal grandfather; Diabetes in her paternal grandfather; Emphysema in her paternal grandfather; Heart attack in her father; Heart disease in her father, paternal grandfather, and paternal grandmother; Irritable bowel syndrome in her sister.    ROS:  Please see the history of present illness.    Review of Systems: Constitutional:  admits to fever, chills, diaphoresis, appetite change and fatigue.  HEENT: denies photophobia, eye pain, redness, hearing loss, ear pain, congestion, sore throat, rhinorrhea, sneezing, neck pain, neck stiffness and tinnitus.  Respiratory: denies SOB, DOE, cough, chest tightness, and wheezing.  Cardiovascular: denies chest pain, palpitations and leg swelling.  Gastrointestinal: denies nausea, vomiting, abdominal pain, diarrhea, constipation, blood in stool.  Genitourinary: denies dysuria, urgency, frequency, hematuria,  flank pain and difficulty urinating.  Musculoskeletal: denies  myalgias, back pain, joint swelling, arthralgias and gait problem.   Skin: denies pallor, rash and wound.  Neurological: denies dizziness, seizures, syncope, weakness, light-headedness, numbness and headaches.   Hematological: denies adenopathy, easy bruising, personal or family bleeding history.  Psychiatric/ Behavioral: denies suicidal ideation, mood changes, confusion, nervousness, sleep disturbance and agitation.        All other systems are reviewed and negative.    PHYSICAL EXAM: VS:  BP 116/76 mmHg  Pulse 67  Ht 4\' 9"  (1.448 m)  Wt 114 lb 12.8 oz (52.073 kg)  BMI 24.84 kg/m2 , BMI Body mass index is 24.84 kg/(m^2). GEN: Well nourished, well developed, in no acute distress HEENT: normal Neck: no JVD, carotid bruits, or masses Cardiac: RRR; no murmurs, rubs, or gallops,no edema  Respiratory:  clear to auscultation bilaterally, normal work of breathing GI: soft, nontender, nondistended, + BS MS: no deformity or atrophy Skin: warm and dry, no rash Neuro:  Strength and sensation are intact Psych: normal   EKG:  EKG is ordered today. The ekg ordered today demonstrates  NSR at 67.   Sinus arrhythmia   Recent Labs: No results found for requested labs within last 365 days.    Lipid Panel No results found for: CHOL, TRIG, HDL, CHOLHDL, VLDL, LDLCALC, LDLDIRECT    Wt Readings from Last 3 Encounters:  11/05/15 114 lb 12.8 oz (52.073 kg)  06/16/14 125 lb (56.7 kg) (44 %*, Z = -0.16)  08/29/13 136 lb (61.689 kg) (66 %*, Z = 0.40)   * Growth percentiles are based on CDC 2-20 Years data.      Other studies Reviewed: Additional studies/ records that were reviewed today include: . Review of the above records demonstrates:    ASSESSMENT AND PLAN:  1.  Hyperlipidemia:   Has a strong family hx of hyperlipidemia and CAD She eats a very poor diet. She is no longer vegan - eats fried foods regularly .   Ive advised her to improve her diet significantly .   She should start to exercise  Will draw labs today  She might need to be started on a statin .  Will see her in 1 year.   Current medicines are reviewed at length with the patient today.  The patient does not have concerns regarding medicines.  The following changes have been made:  no change  Labs/ tests ordered today include:  No orders of the defined types were placed in this encounter.     Disposition:   FU with me in 1  year      Seanne Chirico, Wonda Cheng, MD  11/05/2015 3:05 PM    [facility] [place], [place], [place]  [address] Phone: [phone]; Fax: [phone]   [facility]  [address] [place], [place]  [address] [phone]   Fax [phone]

## 2016-01-26 ENCOUNTER — Ambulatory Visit: Payer: Self-pay | Admitting: Pulmonary Disease

## 2016-05-11 ENCOUNTER — Telehealth: Payer: Self-pay | Admitting: Nurse Practitioner

## 2016-05-11 NOTE — Telephone Encounter (Signed)
Called patient to discuss repeat 3 month fasting lab work.  She is not on a statin currently as she wanted to work on improving diet.  She states her diet has not improved and that she has limited time for appointments due to school  She would like to follow-up on her hyperlipidemia at next office visit in April 2018.  I advised that I our office will contact her in a few months to schedule an appointment.  She verbalized understanding and agreement.

## 2016-07-02 ENCOUNTER — Ambulatory Visit (INDEPENDENT_AMBULATORY_CARE_PROVIDER_SITE_OTHER): Payer: BLUE CROSS/BLUE SHIELD | Admitting: Internal Medicine

## 2016-07-02 ENCOUNTER — Encounter: Payer: Self-pay | Admitting: Internal Medicine

## 2016-07-02 VITALS — BP 112/70 | HR 60 | Ht <= 58 in | Wt 148.8 lb

## 2016-07-02 DIAGNOSIS — J45991 Cough variant asthma: Secondary | ICD-10-CM

## 2016-07-02 MED ORDER — LEVALBUTEROL TARTRATE 45 MCG/ACT IN AERO: 1.0000 | INHALATION_SPRAY | RESPIRATORY_TRACT | 11 refills | Status: AC | PRN

## 2016-07-02 NOTE — Progress Notes (Signed)
Subjective:    Patient ID: Joyce Pena, female    DOB: 1996-04-26   MRN: [id number]   Brief patient profile:   20  yowf never smoker ? 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 Dx of kidnergarden of asthma and seemed better early HS then worse since 2013 ? [place]en in [place] may have made it worse then arrived in [place] last week in Aug 2014 and found couldn't tolerate the outdoors well due to heat and humidity  on no maint rx so referred to pulmonary clinic 04/04/13 by Dr Micheline Chapman.    History of Present Illness  04/04/2013 1st [name] office visit/ [name] cc daily symptoms pretty much all her life esp with exercise, no benefit to singulair,  ? advair helped ? Maybe not.  Much worse with viral illnesses in past and has to take a xopenex neb during these for  Severe cough and sob but otherwise just using the saba hfa. rec dulera 100 Take 2 puffs first thing in am and then another 2 puffs about 12 hours later - blow dulera out through nose  only use your albuterol (xopenox) as a rescue medication   Try prilosec 20mg   Take 30-60 min before first meal of the day and Pepcid 20 mg one bedtime until cough is completely gone for at least a week without the need for cough suppression GERD diet    05/09/2013 f/u ov/[name] re: sob ? asthma Chief Complaint  Patient presents with  . Follow-up    Pt states that her breathing had improved, but has worsened slightly since weather turned colder. She has not had to use rescue inhaler since her last visit.   doe x can't walk 100 yards with back pack s giving out. Variably taking gerd rx for overt HB esp in pm Never wake up with cough or wheeze or need saba rec Dulera 100 Take 2 puffs first thing in am and then another 2 puffs about 12 hours later.  Only use your albuterol as a rescue medication Try prilosec 20mg   Take 30-60 min before first meal of the day and Pepcid 20 mg one bedtime until cough is completely gone for at least a week  without the need for cough suppression Please schedule a follow up office visit in 4 weeks, sooner if needed with pfts on return    08/29/2013 f/u ov/[name] re: sob/asthma Chief Complaint  Patient presents with  . Follow-up    Pt c/o increased cough x 2 days- prod with minimal sputum- ? color. She has not needed rescue inhaler.    no saba x sev weeks, last dulera x 2 weeks prior to OV   Taking pepcid only at hs Harsh barking cough day > night rec Stop dulera as you do not appear to have chronic asthma  Try prilosec 20mg   Take 30-60 min before first meal of the day and Pepcid 20 mg one bedtime   The progesterone in your birth control pill may be contributing to non-acid gerd issues (for future consideration only) Only use your albuterol  GERD diet  For cough mucinex dm up 1200 mg every 12 hours and use the flutter valve to prevent grinding sound as much as possible  Follow is as needed     07/02/2016  f/u ov/[name] re: ? Asthma but off all rx x 2 m Chief Complaint  Patient presents with  . Follow-up    Pt states she may be having  SOB "but may be psychological b/c I don't have an inhaler". She c/o runny nose and non prod cough for the past few days.   no noct symptoms at all  Difficulty ex in cold  Feels a little tight today but not sob/  No obvious other patterns in day to day or daytime variabilty or assoc excess/ purulent sputum or mucus plugs    cp or chest tightness, subjective wheeze overt sinus or hb symptoms. No unusual exp hx or h/o childhood pna/ asthma or knowledge of premature birth.  Sleeping ok without nocturnal  or early am exacerbation  of respiratory  c/o's or need for noct saba. Also denies any obvious fluctuation of symptoms with weather or environmental changes or other aggravating or alleviating factors except as outlined above   Current Medications, Allergies, Complete Past Medical History, Past Surgical History, Family History, and Social History were reviewed in  [employer] record.  ROS  The following are not active complaints unless bolded sore throat, dysphagia, dental problems, itching, sneezing,  nasal congestion or excess/ purulent secretions, ear ache,   fever, chills, sweats, unintended wt loss, pleuritic or exertional cp, hemoptysis,  orthopnea pnd or leg swelling, presyncope, palpitations, heartburn, abdominal pain, anorexia, nausea, vomiting, diarrhea  or change in bowel or urinary habits, change in stools or urine, dysuria,hematuria,  rash, arthralgias, visual complaints, headache, numbness weakness or ataxia or problems with walking or coordination,  change in mood/affect or memory.                   Objective:   Physical Exam  amb wf nad     07/02/2016      148   08/29/13 136 lb (61.689 kg) (66%*, Z = 0.40)  05/09/13 122 lb (55.339 kg) (42%*, Z = -0.21)  04/13/13 123 lb 2 oz (55.849 kg) (45%*, Z = -0.14)   * Growth percentiles are based on CDC 2-20 Years data.           HEENT: nl dentition, turbinates, and orophanx. Nl external ear canals without cough reflex   NECK :  without JVD/Nodes/TM/ nl carotid upstrokes bilaterally   LUNGS: no acc muscle use, clear to A and P bilaterally without cough on insp or exp maneuvers   CV:  RRR  no s3 or murmur or increase in P2, no edema   ABD:  soft and nontender with nl excursion in the supine position. No bruits or organomegaly, bowel sounds nl  MS:  warm without deformities, calf tenderness, cyanosis or clubbing          Assessment & Plan:

## 2016-07-02 NOTE — Patient Instructions (Addendum)
Your lung function is completely normal today without any inhalers in your system   Only use your albuterol as a rescue medication to be used if you can't catch your breath by resting or doing a relaxed purse lip breathing pattern.  - The less you use it, the better it will work when you need it. - Ok to use up to 2 puffs  every 4 hours if you must but call for immediate appointment if use goes up over your usual need - Don't leave home without it !!  (think of it like the spare tire for your car)   Pulmonary follow up is as needed with a methacholine challenge test if not clear what's asthma and what's not ]

## 2016-07-03 NOTE — Assessment & Plan Note (Signed)
-   hfa 90% p coaching 04/04/13  - spirometry wnl 05/09/13 - pft.s 08/29/13 completely nl off all rx with active symptoms of cough  - Spirometry 07/02/2016  wnl with very min curvature off all rx while slt "tight" symptoms  I had an extended discussion with the patient reviewing all relevant studies completed to date and  lasting 15 to 20 minutes of a 25 minute visit on the following ongoing concerns:   1) many of her symptoms are not likely at all to be asthma > advised re the difference between doe related to asthma vs deconditioning   2) if starts needing more than 2 x weekly saba for any reason should call for f/u > methacholine challenge   3) Each maintenance medication was reviewed in detail including most importantly the difference between maintenance and as needed and under what circumstances the prns are to be used.  Please see AVS for  customized  Instructions which were reviewed in detail in writing with the patient and a copy provided.

## 2017-06-08 ENCOUNTER — Encounter: Payer: Self-pay | Admitting: Family Medicine

## 2017-06-08 ENCOUNTER — Ambulatory Visit (INDEPENDENT_AMBULATORY_CARE_PROVIDER_SITE_OTHER): Payer: Managed Care, Other (non HMO) | Admitting: Family Medicine

## 2017-06-08 VITALS — BP 118/77 | HR 65 | Temp 98.2°F | Resp 12 | Ht <= 58 in | Wt 135.5 lb

## 2017-06-08 DIAGNOSIS — F319 Bipolar disorder, unspecified: Secondary | ICD-10-CM

## 2017-06-08 DIAGNOSIS — E7800 Pure hypercholesterolemia, unspecified: Secondary | ICD-10-CM | POA: Insufficient documentation

## 2017-06-08 DIAGNOSIS — L659 Nonscarring hair loss, unspecified: Secondary | ICD-10-CM | POA: Diagnosis not present

## 2017-06-08 NOTE — Patient Instructions (Signed)
A few things to remember from today's visit:   Hair loss disorder - Plan: Basic metabolic panel, TSH, ANA, CBC  Hyperlipidemia, unspecified hyperlipidemia type - Plan: Lipid panel  Rogaine 2 %.   We have ordered labs or studies at this visit.  It can take up to 1-2 weeks for results and processing. IF results require follow up or explanation, we will call you with instructions. Clinically stable results will be released to your [facility]. If you have not heard from [place] or cannot find your results in [facility] in 2 weeks please contact our office at [phone].  If you are not yet signed up for [facility], please consider signing up   Please be sure medication list is accurate. If a new problem present, please set up appointment sooner than planned today.

## 2017-06-08 NOTE — Progress Notes (Signed)
HPI:   Ms.Karuna Sokoloski is a 21 y.o. female, who is here today to establish care.  Former PCP: N/A Last preventive routine visit: Gyn preventive care up to date.  Chronic medical problems: ADHD, bipolar disorder II,insomnia,asthma, migraine, GERD, and HLD among some.  She follows regularly with psychiatrist at [facility].  Concerns today:   Hair loss: Since 11/2016 she has noted increased in hair loss, she has not noted alopecic areas but has lost "50%" of her hair since then. Noted after she had IUD placed,Skyla. Her gyn started Spironolactone 100 mg in 12/2016 and she has not noted any improvement. Hx of PCOS, she was on OCP's but she was not complaint and it was interfering with Lamictal.  Her mood has greatly improved since she stopped OCP.  + Facial hair also noted. Denies changes in voice. No other new medication around the time problem started.  She denies associated skin rash, arthritis, joint erythema or erythema, or worsening fatigue.  She is concerned about this being related to thyroid problems.  She states that she tried to schedule an appointment with endocrinologist but they will not see her without specific diagnosis.  Also concerned about diabetes.  She also would like FLP done. Hx of hypercholesterolemia, TC 210 a few years ago.   Review of Systems  Constitutional: Negative for activity change, appetite change, fatigue and fever.  HENT: Negative for mouth sores, nosebleeds, sore throat, trouble swallowing and voice change.   Eyes: Negative for redness and visual disturbance.  Respiratory: Negative for cough, shortness of breath and wheezing.   Cardiovascular: Negative for chest pain, palpitations and leg swelling.  Gastrointestinal: Negative for abdominal pain, nausea and vomiting.       Negative for changes in bowel habits.  Endocrine: Negative for cold intolerance, heat intolerance, polydipsia, polyphagia and polyuria.    Genitourinary: Negative for decreased urine volume, dysuria and hematuria.  Musculoskeletal: Negative for arthralgias, joint swelling and myalgias.  Skin: Negative for rash and wound.  Allergic/Immunologic: Positive for environmental allergies.  Neurological: Negative for syncope, weakness and headaches.  Hematological: Negative for adenopathy. Does not bruise/bleed easily.  Psychiatric/Behavioral: Positive for sleep disturbance. Negative for hallucinations. The patient is nervous/anxious.       Current Outpatient Medications on File Prior to Visit  Medication Sig Dispense Refill  . amphetamine-dextroamphetamine (ADDERALL) 10 MG tablet Take 10 mg by mouth daily.  0  . aspirin-acetaminophen-caffeine (EXCEDRIN MIGRAINE) 250-250-65 MG per tablet Take 1 tablet by mouth every 6 (six) hours as needed for pain.    Marland Kitchen buPROPion (WELLBUTRIN XL) 150 MG 24 hr tablet Take 150 mg by mouth daily.    [name] FE 1/20 1-20 MG-MCG tablet Take 1 tablet by mouth daily.    Marland Kitchen lamoTRIgine (LAMICTAL) 200 MG tablet Take 200 mg by mouth daily.    Marland Kitchen levalbuterol (XOPENEX HFA) 45 MCG/ACT inhaler Inhale 1-2 puffs into the lungs every 4 (four) hours as needed for wheezing or shortness of breath. 1 Inhaler 11  . lisdexamfetamine (VYVANSE) 60 MG capsule Take 60 mg by mouth daily.     No current facility-administered medications on file prior to visit.      Past Medical History:  Diagnosis Date  . ADD (attention deficit disorder)   . Anxiety   . Asthma   . Bipolar disorder (manic depression) ([place]) 2017  . Borderline personality disorder ([place]) 2017  . Chronic headaches   . Depression   . GERD (gastroesophageal reflux disease)   .  Hyperlipidemia   . IBS (irritable bowel syndrome)   . Rectal prolapse 05/2014   Allergies  Allergen Reactions  . Banana Other (See Comments)    Mouth sores    Family History  Problem Relation Age of Onset  . Heart disease Father   . Heart attack Father   . Emphysema Paternal  Grandfather   . Colon cancer Paternal Grandfather   . Diabetes Paternal Grandfather   . Heart disease Paternal Grandfather   . Asthma Cousin   . Breast cancer Paternal Grandmother   . Heart disease Paternal Grandmother   . Irritable bowel syndrome Sister     Social History   Socioeconomic History  . Marital status: Single    Spouse name: None  . Number of children: 0  . Years of education: None  . Highest education level: None  Social Needs  . Financial resource strain: None  . Food insecurity - worry: None  . Food insecurity - inability: None  . Transportation needs - medical: None  . Transportation needs - non-medical: None  Occupational History  . Occupation: Ship broker  Tobacco Use  . Smoking status: Never Smoker  . Smokeless tobacco: Never Used  Substance and Sexual Activity  . Alcohol use: Yes    Comment: rare  . Drug use: No  . Sexual activity: Yes    Birth control/protection: Pill  Other Topics Concern  . None  Social History Narrative  . None    Vitals:   06/08/17 1353  BP: 118/77  Pulse: 65  Resp: 12  Temp: 98.2 F (36.8 C)  SpO2: 99%    Body mass index is 28.57 kg/m.   Physical Exam  Nursing note and vitals reviewed. Constitutional: She is oriented to person, place, and time. She appears well-developed. No distress.  HENT:  Head: Normocephalic and atraumatic.  Mouth/Throat: Oropharynx is clear and moist and mucous membranes are normal.  Eyes: Conjunctivae are normal. Pupils are equal, round, and reactive to light.  Neck: No tracheal deviation present. No thyroid mass and no thyromegaly present.  Cardiovascular: Normal rate and regular rhythm.  No murmur heard. Pulses:      Dorsalis pedis pulses are 2+ on the right side, and 2+ on the left side.  Respiratory: Effort normal and breath sounds normal. No respiratory distress.  GI: Soft. She exhibits no mass. There is no hepatomegaly. There is no tenderness.  Musculoskeletal: She exhibits no  edema or tenderness.  Lymphadenopathy:    She has no cervical adenopathy.  Neurological: She is alert and oriented to person, place, and time. She has normal strength. Coordination normal.  Skin: Skin is warm. No rash noted. No erythema.  Hair pull test negative (1 hair). No alopecic areas or lesions appreciated. I do not appreciate facial hair.  Psychiatric: Her mood appears anxious.  Well groomed, good eye contact.    ASSESSMENT AND PLAN:   Ms. Kyannah was seen today for establish care.  Diagnoses and all orders for this visit:  Hair loss disorder  We discussed possible causes, including telogen effluvium, medication, hormonal, and idipathic. Hair dye may aggravate problem. Treatment options also review, since Spironolactone is not helping I recommend discontinuing. OTC Rogaine 2 % may help. Further recommendations will be given according to lab results.  If she is interested in dermatology referral she was instructed to call.  -     Basic metabolic panel; Future -     TSH; Future -     ANA; Future -  CBC; Future  Pure hypercholesterolemia  Continue low-fat diet. She will come tomorrow for fasting labs. Further recommendations will be given according to lab results.  -     Lipid panel; Future  Bipolar affective disorder, remission status unspecified ([place])  Reported that improved after discontinuation of OCP. She will continue following with psychiatrist.    Betty G. Martinique, MD  [facility]. [place] office.

## 2017-06-09 LAB — LIPID PANEL
CHOL/HDL RATIO: 3
Cholesterol: 219 mg/dL — ABNORMAL HIGH (ref 0–200)
HDL: 67.5 mg/dL (ref >39.00)
LDL Cholesterol: 137 mg/dL — ABNORMAL HIGH (ref 0–99)
NonHDL: 151.02
TRIGLYCERIDES: 71 mg/dL (ref 0.0–149.0)
VLDL: 14.2 mg/dL (ref 0.0–40.0)

## 2017-06-09 LAB — CBC
HCT: 44.3 % (ref 36.0–46.0)
HEMOGLOBIN: 14.6 g/dL (ref 12.0–15.0)
MCHC: 32.9 g/dL (ref 30.0–36.0)
MCV: 88.5 fl (ref 78.0–100.0)
Platelets: 271 10*3/uL (ref 150.0–400.0)
RBC: 5.01 Mil/uL (ref 3.87–5.11)
RDW: 15 % (ref 11.5–15.5)
WBC: 6.3 10*3/uL (ref 4.0–10.5)

## 2017-06-09 LAB — BASIC METABOLIC PANEL
BUN: 12 mg/dL (ref 6–23)
CO2: 26 mEq/L (ref 19–32)
Calcium: 10.1 mg/dL (ref 8.4–10.5)
Chloride: 103 mEq/L (ref 96–112)
Creatinine, Ser: 0.88 mg/dL (ref 0.40–1.20)
GFR: 84.67 mL/min (ref >60.00)
Glucose, Bld: 103 mg/dL — ABNORMAL HIGH (ref 70–99)
POTASSIUM: 4.2 meq/L (ref 3.5–5.1)
SODIUM: 138 meq/L (ref 135–145)

## 2017-06-09 LAB — TSH: TSH: 2.76 u[IU]/mL (ref 0.35–4.50)

## 2017-06-09 NOTE — Addendum Note (Signed)
Addended by: Tomi Likens on: 06/09/2017 10:10 AM   Modules accepted: Orders

## 2017-06-10 LAB — ANA: ANA: NEGATIVE

## 2017-06-13 ENCOUNTER — Encounter: Payer: Self-pay | Admitting: Family Medicine

## 2017-08-22 ENCOUNTER — Encounter: Payer: Self-pay | Admitting: Family Medicine

## 2017-08-22 ENCOUNTER — Ambulatory Visit (INDEPENDENT_AMBULATORY_CARE_PROVIDER_SITE_OTHER): Payer: Managed Care, Other (non HMO) | Admitting: Family Medicine

## 2017-08-22 VITALS — BP 110/78 | HR 65 | Temp 98.3°F | Resp 12 | Ht <= 58 in | Wt 135.6 lb

## 2017-08-22 DIAGNOSIS — L659 Nonscarring hair loss, unspecified: Secondary | ICD-10-CM

## 2017-08-22 DIAGNOSIS — E282 Polycystic ovarian syndrome: Secondary | ICD-10-CM | POA: Diagnosis not present

## 2017-08-22 NOTE — Progress Notes (Signed)
ACUTE VISIT   HPI:  Chief Complaint  Patient presents with  . Hair/Scalp Problem    patient stated that her hair is falling out and it's getting worse. Needs a referral to a Endocronologist    Joyce Pena is a 22 y.o. female, who is here today complaining of worsening hair loss.  I saw her last on 06/08/2017, when we discussed possible etiologies of her loss and blood workup was done at that time. She attributes problem to IUD, Skyla. She does not want to have IUD removed because other birth control methods (OCP's) were interfering with some of her psychiatric medications.  She has not tried OTC Rogaine as I recommended. In the past she has tried Spelled lactone 25 mg daily but did not help.  Since her last visit she has noted that problem is getting worse. She is also complaining of increasing facial hair.  She denies associated fever, chills, arthralgias, oral lesions, or scalp lesions.  Lab Results  Component Value Date   TSH 2.76 06/09/2017   06/08/17 Negative ANA.  Lab Results  Component Value Date   WBC 6.3 06/09/2017   HGB 14.6 06/09/2017   HCT 44.3 06/09/2017   MCV 88.5 06/09/2017   PLT 271.0 06/09/2017   Lab Results  Component Value Date   CREATININE 0.88 06/09/2017   BUN 12 06/09/2017   NA 138 06/09/2017   K 4.2 06/09/2017   CL 103 06/09/2017   CO2 26 06/09/2017     She has history of PCOS and would like to see an endocrinologist.   Review of Systems  Constitutional: Negative for chills, fatigue and fever.  Gastrointestinal: Negative for abdominal pain, nausea and vomiting.       No changes in bowel habits.  Endocrine: Negative for cold intolerance and heat intolerance.  Musculoskeletal: Negative for arthralgias, joint swelling and myalgias.  Skin: Negative for rash.  Neurological: Negative for syncope, weakness and headaches.  Hematological: Negative for adenopathy. Does not bruise/bleed easily.  Psychiatric/Behavioral:  Negative for confusion. The patient is nervous/anxious.       Current Outpatient Medications on File Prior to Visit  Medication Sig Dispense Refill  . amphetamine-dextroamphetamine (ADDERALL) 10 MG tablet Take 10 mg by mouth daily.  0  . aspirin-acetaminophen-caffeine (EXCEDRIN MIGRAINE) 250-250-65 MG per tablet Take 1 tablet by mouth every 6 (six) hours as needed for pain.    Marland Kitchen buPROPion (WELLBUTRIN XL) 150 MG 24 hr tablet Take 150 mg by mouth daily.    Joyce Pena FE 1/20 1-20 MG-MCG tablet Take 1 tablet by mouth daily.    Marland Kitchen lamoTRIgine (LAMICTAL) 200 MG tablet Take 200 mg by mouth daily.    Marland Kitchen levalbuterol (XOPENEX HFA) 45 MCG/ACT inhaler Inhale 1-2 puffs into the lungs every 4 (four) hours as needed for wheezing or shortness of breath. 1 Inhaler 11  . lisdexamfetamine (VYVANSE) 60 MG capsule Take 60 mg by mouth daily.     No current facility-administered medications on file prior to visit.      Past Medical History:  Diagnosis Date  . ADD (attention deficit disorder)   . Anxiety   . Asthma   . Bipolar disorder (manic depression) ([place]) 2017  . Borderline personality disorder ([place]) 2017  . Chronic headaches   . Depression   . GERD (gastroesophageal reflux disease)   . Hyperlipidemia   . IBS (irritable bowel syndrome)   . Rectal prolapse 05/2014   Allergies  Allergen Reactions  .  Banana Other (See Comments)    Mouth sores    Social History   Socioeconomic History  . Marital status: Single    Spouse name: None  . Number of children: 0  . Years of education: None  . Highest education level: None  Social Needs  . Financial resource strain: None  . Food insecurity - worry: None  . Food insecurity - inability: None  . Transportation needs - medical: None  . Transportation needs - non-medical: None  Occupational History  . Occupation: Ship broker  Tobacco Use  . Smoking status: Never Smoker  . Smokeless tobacco: Never Used  Substance and Sexual Activity  . Alcohol use:  Yes    Comment: rare  . Drug use: No  . Sexual activity: Yes    Birth control/protection: Pill  Other Topics Concern  . None  Social History Narrative  . None    Vitals:   08/22/17 1618  BP: 110/78  Pulse: 65  Resp: 12  Temp: 98.3 F (36.8 C)  SpO2: 98%   Body mass index is 28.59 kg/m.   Physical Exam  Nursing note and vitals reviewed. Constitutional: She is oriented to person, place, and time. She appears well-developed. No distress.  HENT:  Head: Atraumatic.  Mouth/Throat: Uvula is midline and oropharynx is clear and moist. Mucous membranes are dry.  Eyes: Conjunctivae are normal.  Cardiovascular: Normal rate and regular rhythm.  Respiratory: Effort normal and breath sounds normal. No respiratory distress.  Musculoskeletal: She exhibits no edema.  Lymphadenopathy:    She has no cervical adenopathy.  Neurological: She is alert and oriented to person, place, and time. She has normal strength. Gait normal.  Skin: Skin is warm. No rash noted. No erythema.  I do not appreciate alopecic areas on the scalp or significant facial hair.   Hair is thin, no scalp lesions. Hair pull test negative.   Psychiatric: Her mood appears anxious.  Well groomed, good eye contact.    ASSESSMENT AND PLAN:   Joyce Pena was seen today for hair/scalp problem.  Diagnoses and all orders for this visit:  Hair loss disorder  We discussed possible etiologies. So far workup has been negative. She would like a referral to endocrinologist rather than dermatologist, she thinks this problem might be associated with her history of PCOS. Recommend trying OTC Rogaine.  -     Ambulatory referral to Endocrinology  PCOS (polycystic ovarian syndrome)  Educated about criteria diagnostic as well as treatment options.  -     Ambulatory referral to Endocrinology      Joyce Pena G. Martinique, MD  [facility]. [place] office.

## 2017-08-22 NOTE — Patient Instructions (Addendum)
A few things to remember from today's visit:   Hair loss disorder - Plan: Ambulatory referral to Endocrinology  PCOS (polycystic ovarian syndrome) - Plan: Ambulatory referral to Endocrinology    Please be sure medication list is accurate. If a new problem present, please set up appointment sooner than planned today.       Hair lo

## 2017-08-23 ENCOUNTER — Telehealth: Payer: Self-pay | Admitting: Family Medicine

## 2017-08-23 NOTE — Telephone Encounter (Signed)
I already placed referral to endocrinologist. Do I need to place another referral?  Thanks, BJ

## 2017-08-23 NOTE — Telephone Encounter (Signed)
Copied from [place]. Topic: Quick Communication - See Telephone Encounter >> Aug 23, 2017  2:04 PM Cleaster Corin, NT wrote: CRM for notification. See Telephone encounter for:   08/23/17. Pt. Calling to see if she can get another referral to different endourology office.  The appt. She has they are not able to get her in until march wants to  be seen  somewhere sooner pt. Can be reached at [phone]

## 2017-08-24 ENCOUNTER — Other Ambulatory Visit: Payer: Self-pay | Admitting: *Deleted

## 2017-08-24 DIAGNOSIS — L659 Nonscarring hair loss, unspecified: Secondary | ICD-10-CM

## 2017-08-24 DIAGNOSIS — E282 Polycystic ovarian syndrome: Secondary | ICD-10-CM

## 2017-08-24 NOTE — Telephone Encounter (Signed)
Spoke with patient, she would like a referral to [facility] with Dr. Anda Kraft. Referral placed.

## 2017-08-24 NOTE — Telephone Encounter (Signed)
Patient returning call, call back [phone]

## 2017-10-13 ENCOUNTER — Encounter: Payer: Self-pay | Admitting: Internal Medicine

## 2017-10-13 ENCOUNTER — Ambulatory Visit (INDEPENDENT_AMBULATORY_CARE_PROVIDER_SITE_OTHER): Payer: Managed Care, Other (non HMO) | Admitting: Internal Medicine

## 2017-10-13 VITALS — BP 126/78 | HR 108 | Ht <= 58 in | Wt 130.4 lb

## 2017-10-13 DIAGNOSIS — E282 Polycystic ovarian syndrome: Secondary | ICD-10-CM | POA: Diagnosis not present

## 2017-10-13 DIAGNOSIS — L659 Nonscarring hair loss, unspecified: Secondary | ICD-10-CM | POA: Diagnosis not present

## 2017-10-13 LAB — HEMOGLOBIN A1C: HEMOGLOBIN A1C: 5.3 % (ref 4.6–6.5)

## 2017-10-13 MED ORDER — METFORMIN HCL ER 500 MG PO TB24: 500.0000 mg | ORAL_TABLET | Freq: Every day | ORAL | 3 refills | Status: AC

## 2017-10-13 NOTE — Progress Notes (Signed)
Patient ID: Joyce Pena, female   DOB: 28-Mar-1996, 22 y.o.   MRN: [id number]   HPI: Joyce Pena is a 22 y.o. female, referred by Dr. Martinique, for evaluation and treatment of PCOS and hair loss.  PCOS: Fertility/Menstrual cycles: - menarche at 22 y/o - irregular menses  Since middle school - + h/o ovarian cysts in middle school - children: 0 - miscarriages: 0 - contraception: OCPs since 8th grade >> senior year of college ([age] y/o) but then [place] IUD. She felt much better, but hair loss worsened.   Acne: - worse lately  - forehead, back  Hirsutism: - started soon after starting IUD >> worse now  Weight gain: -  Gained ~40 lbs  in 1.5 years - binge eating >> now at 130s (plateau) - no steroid use - no weight loss meds - Diets tried: no  - Exercise: no  Treatments tried: - did not try Metformin - Did try Spironolactone, but this did not help - did not try Vaniqa - Did try OCPs, but this caused mood changes and she was also forgetting to take the doses - tried Rogaine >> did not help  - Last thyroid tests: Lab Results  Component Value Date   TSH 2.76 06/09/2017   -+ HL; last set of lipids:    Component Value Date/Time   CHOL 219 (H) 06/09/2017 1010   TRIG 71.0 06/09/2017 1010   HDL 67.50 06/09/2017 1010   CHOLHDL 3 06/09/2017 1010   VLDL 14.2 06/09/2017 1010   LDLCALC 137 (H) 06/09/2017 1010   -No HbA1c levels available for review: No results found for: HGBA1C  She has FH of PCOS in sister. PGF had DM2. Father with HL. Father and GF with early AMIs.   Hair loss: -She tells me that this improved and actually resolved since she started to see [facility] and they diagnosed her with MTHFR mutation, various nutrient deficiencies, several food allergies, heavy metal intoxication, and was started on treatment for these.    She did try Propecia but this did not help.  She also tried spironolactone (low-dose), which did not  help.  Reviewed other pertinent labs from [place] brought by the patient (08/30/2017): CMP normal, with the exception of glucose (after she had a juice box) of 103; BUN/creatinine 6/0.71, eGFR 120 Vitamin 1, 25, dihydroxy D normal, at 71.9 (19.9-79.3)  25 hydroxy vitamin D Slightly low, at 28.2 (30-100) >> she was started on vitamin D3 5000 units daily and vitamin K to 150 mcg daily Histamine 45 (12-127) GGT 24 (0-60) >> she was started on glutathione Vitamin B12 552 >> she was started on methylation complete Copper 149 (72-166)  Zinc 79 (56-1 ferritin 34) TSH 2.510, free T3 3.4 (2-4.4), free T4 1.59 (0.82-1.77), reverse T3 27.9 (9.2-24.1) >> she was started on turmeric ATA antibody <1 TPO antibody 11 (0-34) Ferritin 31 (15-150) Total testosterone 51 (8-48), Testosterone free and weakly bound 3.4 (0-9.5) Cortisol 10.3 (6.2-19.4)   ROS: Constitutional: + weight gain and weight gain, + fatigue, poor sleep, + both heat and cold intolerance Eyes:, +  blurry vision, no xerophthalmia ENT: no sore throat, no nodules palpated in throat, no dysphagia/odynophagia, no hoarseness, + tinnitus Cardiovascular: no CP/SOB/palpitations/leg swelling Respiratory: no cough/SOB Gastrointestinal: + Nausea, no vomiting, + diarrhea, no constipation, no acid reflux Musculoskeletal: no muscle/joint aches Skin: + acne on upper back and forehead, + hair on face Neurological: no tremors/numbness/tingling/dizziness Psychiatric: + Both depression/anxiety  + low  libido  Past Medical History:  Diagnosis Date  . ADD (attention deficit disorder)   . Anxiety   . Asthma   . Bipolar disorder (manic depression) ([place]) 2017  . Borderline personality disorder ([place]) 2017  . Chronic headaches   . Depression   . GERD (gastroesophageal reflux disease)   . Hyperlipidemia   . IBS (irritable bowel syndrome)   . Rectal prolapse 05/2014   Past Surgical History:  Procedure Laterality Date  . COLONOSCOPY N/A 04/19/2013    Procedure: COLONOSCOPY;  Surgeon: Lafayette Dragon, MD;  Location: [facility];  Service: Endoscopy;  Laterality: N/A;  . RECTUM SURGERY     HAD A RECTAL PROLAPSE  . TONSILLECTOMY AND ADENOIDECTOMY     Social History   Socioeconomic History  . Marital status: Single    Spouse name: Not on file  . Number of children: 0  Social Needs  Occupational History  . Occupation:  unemployed  Tobacco Use  . Smoking status: Never Smoker  . Smokeless tobacco: Never Used  Substance and Sexual Activity  . Alcohol use: Yes    Comment: Beer and vodka 5-7 days a week, 1-6 drinks at a time  . Drug use:  Smokes marijuana  . Sexual activity: Yes    Birth control/protection:  IUD   Current Outpatient Medications on File Prior to Visit  Medication Sig Dispense Refill  . amphetamine-dextroamphetamine (ADDERALL) 10 MG tablet Take 10 mg by mouth daily.  0  . aspirin-acetaminophen-caffeine (EXCEDRIN MIGRAINE) 250-250-65 MG per tablet Take 1 tablet by mouth every 6 (six) hours as needed for pain.    Marland Kitchen buPROPion (WELLBUTRIN XL) 150 MG 24 hr tablet Take 150 mg by mouth daily.    [name] FE 1/20 1-20 MG-MCG tablet Take 1 tablet by mouth daily.    Marland Kitchen lamoTRIgine (LAMICTAL) 200 MG tablet Take 200 mg by mouth daily.    Marland Kitchen levalbuterol (XOPENEX HFA) 45 MCG/ACT inhaler Inhale 1-2 puffs into the lungs every 4 (four) hours as needed for wheezing or shortness of breath. 1 Inhaler 11  . lisdexamfetamine (VYVANSE) 60 MG capsule Take 60 mg by mouth daily.     No current facility-administered medications on file prior to visit.    Allergies  Allergen Reactions  . Banana Other (See Comments)    Mouth sores   Family History  Problem Relation Age of Onset  . Heart disease Father   . Heart attack Father   . Emphysema Paternal Grandfather   . Colon cancer Paternal Grandfather   . Diabetes Paternal Grandfather   . Heart disease Paternal Grandfather   . Asthma Cousin   . Breast cancer Paternal Grandmother   . Heart  disease Paternal Grandmother   . Irritable bowel syndrome Sister     PE: BP 126/78 (BP Location: Left Arm, Patient Position: Sitting, Cuff Size: Large)   Pulse (!) 108   Ht 4' 9.75" (1.467 m)   Wt 130 lb 6.4 oz (59.1 kg)   SpO2 99%   BMI 27.49 kg/m  Wt Readings from Last 3 Encounters:  10/13/17 130 lb 6.4 oz (59.1 kg)  08/22/17 135 lb 9.6 oz (61.5 kg)  06/08/17 135 lb 8 oz (61.5 kg)   Constitutional: Obese, in NAD, no full supraclavicular fat pads Eyes: PERRLA, EOMI, no exophthalmos ENT: moist mucous membranes, no thyromegaly, no cervical lymphadenopathy Cardiovascular: + Tachycardia, RR, No MRG Respiratory: CTA B Gastrointestinal: abdomen soft, NT, ND, BS+ Musculoskeletal: no deformities, strength intact in all 4 Skin: moist,  warm; + acne on forehead and upper back, + dark terminal hair on chin, + vellum on sideburns, no skin tags, + acanthosis nigricans, no purple, wide, stretch marks; many tattoos Neurological: no tremor with outstretched hands, DTR normal in all 4  ASSESSMENT: 1. PCOS  2. Hair loss  PLAN: 1.  I had a long discussion with the patient about the fact that the PCOS is a misnomer, a patient does not necessarily have to have polycystic ovaries to be diagnosed with the disorder (however, she does describe that she had cysts in her ovaries at diagnosis); this is of sum of several conditions, including:  weight gain  insulin resistance (and therefore a higher risk of developing diabetes later in life)  acne  hirsutism  irregular menstrual cycles  decreased fertility. - We also discussed about the fact that the treatment is usually targeted to addressing the problem that concerns the patient the most: acne/hirsutism, weight gain, or fertility, but there is no single treatment for PCOS.  - The first-line therapy are oral contraceptives. If she is concerned with her weight, we can use metformin; if she is concerned about acne/hirsutism, we can add  spironolactone; and if she is concerned about fertility, I could refer her to reproductive endocrinology for possible use of clomiphene.  She is on IUD as she was forgetting to take oral contraceptives before and also because they were interfering with her psychotropic medication.  She did not feel good on OCPs and her mood is much better after she stopped.  She would not want to take her IUD out and I fully agree.   - We discussed that since she is bothered by scalp hair loss and hirsutism on chin, we can try again spironolactone, but at the higher dose, since before she tried it at a low dose and felt that it did not have much of an effect. I suggested to start 25 mg daily for 3 days, then increase to 25 mg twice a day for the next 7 days and then have a potassium level checked.  She will be [place] then and I printed her the labs to take to [facility] there.  I also advised her to have a blood pressure checked at that time to make sure she is not low.  If the potassium and blood pressure level reading are good, we can increase the dose to 50 mg twice a day.  She agrees with this plan. - Since she is also worried about her weight and the fact that she plateaued after initially being successful in losing weight, I suggested to try metformin.  She would like to try the extended release metformin since she has nausea on a regular basis.  We will started a low dose and advance as tolerated.  I advised her not to start metformin along with the spironolactone, but only after she gets used to that medication. - We will check an HbA1c today, and since I do not have initial labs for her, I will also check a prolactin in the 17 hydroxyprogesterone to rule out hyperprolactinemia and [place]-[facility] - RTC in 6 mo  2. 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   Philemon Kingdom, MD PhD [facility] Endocrinology

## 2017-10-13 NOTE — Patient Instructions (Addendum)
Please start Metformin ER 500 mg with dinner x 4 days. If you tolerate this well, add another Metformin tablet (500 mg) with breakfast. Continue with 500 mg of metformin 2x a day with breakfast and dinner.  Please start Spironolactone 25 mg once a day for 3 days, then increase to 25 mg 2x a day. After 1 week, come back for a nurse appt and lab work.  Start Metformin at least 2 weeks after Spironolactone.  Please come back for a follow-up appointment in 6 months.

## 2017-10-15 ENCOUNTER — Encounter: Payer: Self-pay | Admitting: Internal Medicine

## 2017-10-18 LAB — PROLACTIN: Prolactin: 8 ng/mL

## 2017-10-18 LAB — 17-HYDROXYPROGESTERONE: 17-OH-PROGESTERONE, LC/MS/MS: 58 ng/dL

## 2018-04-14 ENCOUNTER — Ambulatory Visit: Payer: Self-pay | Admitting: Internal Medicine
